# Patient Record
Sex: Female | Born: 1940
Health system: Southern US, Community
[De-identification: ages and names within clinical notes are randomized; demographics above are authoritative.]

---

## 1997-11-23 ENCOUNTER — Other Ambulatory Visit: Admission: RE | Admit: 1997-11-23 | Discharge: 1997-11-23 | Payer: Self-pay | Admitting: *Deleted

## 1998-12-21 ENCOUNTER — Other Ambulatory Visit: Admission: RE | Admit: 1998-12-21 | Discharge: 1998-12-21 | Payer: Self-pay | Admitting: *Deleted

## 1999-07-01 ENCOUNTER — Emergency Department (HOSPITAL_COMMUNITY): Admission: EM | Admit: 1999-07-01 | Discharge: 1999-07-01 | Payer: Self-pay

## 2000-01-21 ENCOUNTER — Other Ambulatory Visit: Admission: RE | Admit: 2000-01-21 | Discharge: 2000-01-21 | Payer: Self-pay | Admitting: *Deleted

## 2001-01-26 ENCOUNTER — Other Ambulatory Visit: Admission: RE | Admit: 2001-01-26 | Discharge: 2001-01-26 | Payer: Self-pay | Admitting: *Deleted

## 2001-01-28 ENCOUNTER — Encounter: Payer: Self-pay | Admitting: *Deleted

## 2001-01-28 ENCOUNTER — Encounter: Admission: RE | Admit: 2001-01-28 | Discharge: 2001-01-28 | Payer: Self-pay | Admitting: *Deleted

## 2001-11-25 ENCOUNTER — Ambulatory Visit (HOSPITAL_COMMUNITY): Admission: RE | Admit: 2001-11-25 | Discharge: 2001-11-25 | Payer: Self-pay | Admitting: Gastroenterology

## 2002-02-16 ENCOUNTER — Encounter: Admission: RE | Admit: 2002-02-16 | Discharge: 2002-02-16 | Payer: Self-pay | Admitting: Family Medicine

## 2002-02-16 ENCOUNTER — Encounter: Payer: Self-pay | Admitting: Family Medicine

## 2002-03-16 ENCOUNTER — Other Ambulatory Visit: Admission: RE | Admit: 2002-03-16 | Discharge: 2002-03-16 | Payer: Self-pay | Admitting: Obstetrics and Gynecology

## 2003-03-16 ENCOUNTER — Encounter: Admission: RE | Admit: 2003-03-16 | Discharge: 2003-03-16 | Payer: Self-pay | Admitting: Obstetrics and Gynecology

## 2003-04-28 ENCOUNTER — Other Ambulatory Visit: Admission: RE | Admit: 2003-04-28 | Discharge: 2003-04-28 | Payer: Self-pay | Admitting: Obstetrics and Gynecology

## 2004-03-22 ENCOUNTER — Ambulatory Visit (HOSPITAL_COMMUNITY): Admission: RE | Admit: 2004-03-22 | Discharge: 2004-03-22 | Payer: Self-pay | Admitting: Obstetrics and Gynecology

## 2004-05-17 ENCOUNTER — Other Ambulatory Visit: Admission: RE | Admit: 2004-05-17 | Discharge: 2004-05-17 | Payer: Self-pay | Admitting: *Deleted

## 2005-04-15 ENCOUNTER — Ambulatory Visit (HOSPITAL_COMMUNITY): Admission: RE | Admit: 2005-04-15 | Discharge: 2005-04-15 | Payer: Self-pay | Admitting: Obstetrics & Gynecology

## 2005-05-29 ENCOUNTER — Other Ambulatory Visit: Admission: RE | Admit: 2005-05-29 | Discharge: 2005-05-29 | Payer: Self-pay | Admitting: Obstetrics & Gynecology

## 2006-05-01 ENCOUNTER — Ambulatory Visit (HOSPITAL_COMMUNITY): Admission: RE | Admit: 2006-05-01 | Discharge: 2006-05-01 | Payer: Self-pay | Admitting: Obstetrics & Gynecology

## 2007-05-27 ENCOUNTER — Ambulatory Visit (HOSPITAL_COMMUNITY): Admission: RE | Admit: 2007-05-27 | Discharge: 2007-05-27 | Payer: Self-pay | Admitting: Obstetrics & Gynecology

## 2008-03-23 ENCOUNTER — Other Ambulatory Visit: Admission: RE | Admit: 2008-03-23 | Discharge: 2008-03-23 | Payer: Self-pay | Admitting: Obstetrics & Gynecology

## 2008-06-10 ENCOUNTER — Ambulatory Visit (HOSPITAL_COMMUNITY): Admission: RE | Admit: 2008-06-10 | Discharge: 2008-06-10 | Payer: Self-pay | Admitting: Family Medicine

## 2009-07-20 ENCOUNTER — Ambulatory Visit (HOSPITAL_COMMUNITY): Admission: RE | Admit: 2009-07-20 | Discharge: 2009-07-20 | Payer: Self-pay | Admitting: Family Medicine

## 2010-01-28 ENCOUNTER — Encounter: Payer: Self-pay | Admitting: Obstetrics & Gynecology

## 2010-05-25 NOTE — Op Note (Signed)
   NAMEGIULIANNA, ROCHA ANN                       ACCOUNT NO.:  [id number]   MEDICAL RECORD NO.:  [id number]                   PATIENT TYPE:  AMB   LOCATION:  ENDO                                 FACILITY:  [facility]   PHYSICIAN:  Danise Edge, M.D.                DATE OF BIRTH:  May 10, 1940   DATE OF PROCEDURE:  11/25/2001  DATE OF DISCHARGE:                                 OPERATIVE REPORT   PROCEDURE:  Proctocolonoscopy to the splenic flexure.   INDICATIONS FOR PROCEDURE:  Ms. Lailah Marcelli is a 70 year old female born  29-Dec-1940. Approximately twice a year, Ms. Orton will experience  predominantly nocturnal anal discomfort which resolves over a period of a  few minutes to hours after taking Tylenol. On occasion, she will spot fresh  blood on the toilet tissue following normal bowel movements. Her December 25, 1997 health maintenance flexible proctosigmoidoscopy was normal.   ALLERGIES:  PENICILLIN.   PAST MEDICAL HISTORY:  Eczema.   FAMILY HISTORY:  Negative for colon cancer.   ENDOSCOPIST:  Charolett Bumpers, M.D.   PREMEDICATION:  Demerol 50 mg subcutaneously and 20 mg intravenously, Versed  5 mg subcutaneously and 2.5 mg intravenously. Subcutaneous injections of  medication was due to infiltration of the IV.   DESCRIPTION OF PROCEDURE:  After obtaining informed consent, Ms. Cropley was  placed in the left lateral decubitus position. She received intravenous  Demerol and intravenous Versed for conscious sedation for the procedure. The  patient's blood pressure, oxygen saturation and cardiac rhythm were  monitored throughout the procedure and documented in the medical record.   Anal inspection was normal. Digital rectal exam was normal. The Olympus  pediatric video colonoscope was introduced into the rectum and advanced to  the splenic flexure. Due to colonic loop formation which cannot be overcome  despite repositioning the patient from the left lateral decubitus  position  to the supine position and right lateral decubitus position with the  application of external abdominal pressure, a complete colonoscopy was not  performed.   Endoscopic appearance of the rectum, sigmoid colon, descending colon, and  splenic flexure was normal.  There was no endoscopic evidence for the  presence of colorectal neoplasia or inflammatory bowel disease.    ASSESSMENT:  Normal flexible proctocolonoscopy to the splenic flexure. Due  to colonic loop formation, I was unable to complete a full colonoscopy.                                               Danise Edge, M.D.    MJ/MEDQ  D:  11/25/2001  T:  11/25/2001  Job:  [id number]   cc:   Donia Guiles, M.D.  [address] [place]  [place] [address]  Fax: [phone]

## 2010-07-25 ENCOUNTER — Other Ambulatory Visit: Payer: Self-pay | Admitting: Obstetrics & Gynecology

## 2010-07-25 DIAGNOSIS — Z1231 Encounter for screening mammogram for malignant neoplasm of breast: Secondary | ICD-10-CM

## 2010-08-03 ENCOUNTER — Ambulatory Visit (HOSPITAL_COMMUNITY)
Admission: RE | Admit: 2010-08-03 | Discharge: 2010-08-03 | Disposition: A | Discharge status: Home / Self Care | Payer: Medicare Other | Source: Ambulatory Visit | Attending: Obstetrics & Gynecology | Admitting: Obstetrics & Gynecology

## 2010-08-03 DIAGNOSIS — Z1231 Encounter for screening mammogram for malignant neoplasm of breast: Secondary | ICD-10-CM

## 2011-01-23 DIAGNOSIS — M069 Rheumatoid arthritis, unspecified: Secondary | ICD-10-CM | POA: Diagnosis not present

## 2011-01-23 DIAGNOSIS — L02619 Cutaneous abscess of unspecified foot: Secondary | ICD-10-CM | POA: Diagnosis not present

## 2011-01-23 DIAGNOSIS — B372 Candidiasis of skin and nail: Secondary | ICD-10-CM | POA: Diagnosis not present

## 2011-01-23 DIAGNOSIS — L03039 Cellulitis of unspecified toe: Secondary | ICD-10-CM | POA: Diagnosis not present

## 2011-02-06 DIAGNOSIS — L02619 Cutaneous abscess of unspecified foot: Secondary | ICD-10-CM | POA: Diagnosis not present

## 2011-02-06 DIAGNOSIS — L03039 Cellulitis of unspecified toe: Secondary | ICD-10-CM | POA: Diagnosis not present

## 2011-02-06 DIAGNOSIS — B372 Candidiasis of skin and nail: Secondary | ICD-10-CM | POA: Diagnosis not present

## 2011-02-11 DIAGNOSIS — H04129 Dry eye syndrome of unspecified lacrimal gland: Secondary | ICD-10-CM | POA: Diagnosis not present

## 2011-04-18 DIAGNOSIS — L259 Unspecified contact dermatitis, unspecified cause: Secondary | ICD-10-CM | POA: Diagnosis not present

## 2011-04-18 DIAGNOSIS — D485 Neoplasm of uncertain behavior of skin: Secondary | ICD-10-CM | POA: Diagnosis not present

## 2011-04-24 DIAGNOSIS — Z1211 Encounter for screening for malignant neoplasm of colon: Secondary | ICD-10-CM | POA: Diagnosis not present

## 2011-04-26 DIAGNOSIS — L259 Unspecified contact dermatitis, unspecified cause: Secondary | ICD-10-CM | POA: Diagnosis not present

## 2011-05-02 DIAGNOSIS — L930 Discoid lupus erythematosus: Secondary | ICD-10-CM | POA: Diagnosis not present

## 2011-05-13 DIAGNOSIS — L930 Discoid lupus erythematosus: Secondary | ICD-10-CM | POA: Diagnosis not present

## 2011-05-20 DIAGNOSIS — Z79899 Other long term (current) drug therapy: Secondary | ICD-10-CM | POA: Diagnosis not present

## 2011-05-29 DIAGNOSIS — IMO0002 Reserved for concepts with insufficient information to code with codable children: Secondary | ICD-10-CM | POA: Diagnosis not present

## 2011-05-29 DIAGNOSIS — L02419 Cutaneous abscess of limb, unspecified: Secondary | ICD-10-CM | POA: Diagnosis not present

## 2011-05-30 DIAGNOSIS — L930 Discoid lupus erythematosus: Secondary | ICD-10-CM | POA: Diagnosis not present

## 2011-08-15 ENCOUNTER — Other Ambulatory Visit (HOSPITAL_COMMUNITY): Payer: Self-pay | Admitting: Advanced Practice Midwife

## 2011-08-15 DIAGNOSIS — Z1231 Encounter for screening mammogram for malignant neoplasm of breast: Secondary | ICD-10-CM

## 2011-09-10 ENCOUNTER — Ambulatory Visit (HOSPITAL_COMMUNITY)
Admission: RE | Admit: 2011-09-10 | Discharge: 2011-09-10 | Disposition: A | Discharge status: Home / Self Care | Payer: Medicare Other | Source: Ambulatory Visit | Attending: Advanced Practice Midwife | Admitting: Advanced Practice Midwife

## 2011-09-10 DIAGNOSIS — Z1231 Encounter for screening mammogram for malignant neoplasm of breast: Secondary | ICD-10-CM | POA: Insufficient documentation

## 2011-09-17 DIAGNOSIS — M329 Systemic lupus erythematosus, unspecified: Secondary | ICD-10-CM | POA: Diagnosis not present

## 2011-10-17 DIAGNOSIS — Z23 Encounter for immunization: Secondary | ICD-10-CM | POA: Diagnosis not present

## 2011-11-12 DIAGNOSIS — Z79899 Other long term (current) drug therapy: Secondary | ICD-10-CM | POA: Diagnosis not present

## 2011-11-15 DIAGNOSIS — E039 Hypothyroidism, unspecified: Secondary | ICD-10-CM | POA: Diagnosis not present

## 2011-11-15 DIAGNOSIS — E782 Mixed hyperlipidemia: Secondary | ICD-10-CM | POA: Diagnosis not present

## 2011-11-15 DIAGNOSIS — M949 Disorder of cartilage, unspecified: Secondary | ICD-10-CM | POA: Diagnosis not present

## 2011-11-15 DIAGNOSIS — Z1211 Encounter for screening for malignant neoplasm of colon: Secondary | ICD-10-CM | POA: Diagnosis not present

## 2011-11-15 DIAGNOSIS — Z01419 Encounter for gynecological examination (general) (routine) without abnormal findings: Secondary | ICD-10-CM | POA: Diagnosis not present

## 2011-11-15 DIAGNOSIS — Z0000 Encounter for general adult medical examination without abnormal findings: Secondary | ICD-10-CM | POA: Diagnosis not present

## 2011-11-15 DIAGNOSIS — M899 Disorder of bone, unspecified: Secondary | ICD-10-CM | POA: Diagnosis not present

## 2011-11-26 DIAGNOSIS — L259 Unspecified contact dermatitis, unspecified cause: Secondary | ICD-10-CM | POA: Diagnosis not present

## 2012-05-14 DIAGNOSIS — E782 Mixed hyperlipidemia: Secondary | ICD-10-CM | POA: Diagnosis not present

## 2012-07-06 DIAGNOSIS — L930 Discoid lupus erythematosus: Secondary | ICD-10-CM | POA: Diagnosis not present

## 2012-07-14 DIAGNOSIS — L930 Discoid lupus erythematosus: Secondary | ICD-10-CM | POA: Diagnosis not present

## 2012-10-22 DIAGNOSIS — Z23 Encounter for immunization: Secondary | ICD-10-CM | POA: Diagnosis not present

## 2012-11-19 ENCOUNTER — Other Ambulatory Visit (HOSPITAL_COMMUNITY): Payer: Self-pay | Admitting: Family Medicine

## 2012-11-19 DIAGNOSIS — E039 Hypothyroidism, unspecified: Secondary | ICD-10-CM | POA: Diagnosis not present

## 2012-11-19 DIAGNOSIS — Z1211 Encounter for screening for malignant neoplasm of colon: Secondary | ICD-10-CM | POA: Diagnosis not present

## 2012-11-19 DIAGNOSIS — Z1231 Encounter for screening mammogram for malignant neoplasm of breast: Secondary | ICD-10-CM

## 2012-11-19 DIAGNOSIS — M899 Disorder of bone, unspecified: Secondary | ICD-10-CM | POA: Diagnosis not present

## 2012-11-19 DIAGNOSIS — Z23 Encounter for immunization: Secondary | ICD-10-CM | POA: Diagnosis not present

## 2012-11-19 DIAGNOSIS — E782 Mixed hyperlipidemia: Secondary | ICD-10-CM | POA: Diagnosis not present

## 2012-11-19 DIAGNOSIS — M6281 Muscle weakness (generalized): Secondary | ICD-10-CM | POA: Diagnosis not present

## 2012-11-19 DIAGNOSIS — R1011 Right upper quadrant pain: Secondary | ICD-10-CM | POA: Diagnosis not present

## 2012-11-19 DIAGNOSIS — Z01419 Encounter for gynecological examination (general) (routine) without abnormal findings: Secondary | ICD-10-CM | POA: Diagnosis not present

## 2012-11-19 DIAGNOSIS — Z0000 Encounter for general adult medical examination without abnormal findings: Secondary | ICD-10-CM | POA: Diagnosis not present

## 2012-11-24 DIAGNOSIS — R29898 Other symptoms and signs involving the musculoskeletal system: Secondary | ICD-10-CM | POA: Diagnosis not present

## 2012-12-01 DIAGNOSIS — R29898 Other symptoms and signs involving the musculoskeletal system: Secondary | ICD-10-CM | POA: Diagnosis not present

## 2012-12-07 ENCOUNTER — Ambulatory Visit (HOSPITAL_COMMUNITY)
Admission: RE | Admit: 2012-12-07 | Discharge: 2012-12-07 | Disposition: A | Discharge status: Home / Self Care | Payer: Medicare Other | Source: Ambulatory Visit | Attending: Family Medicine | Admitting: Family Medicine

## 2012-12-07 DIAGNOSIS — Z1231 Encounter for screening mammogram for malignant neoplasm of breast: Secondary | ICD-10-CM | POA: Insufficient documentation

## 2012-12-22 DIAGNOSIS — M899 Disorder of bone, unspecified: Secondary | ICD-10-CM | POA: Diagnosis not present

## 2012-12-28 DIAGNOSIS — L930 Discoid lupus erythematosus: Secondary | ICD-10-CM | POA: Diagnosis not present

## 2013-01-06 DIAGNOSIS — L930 Discoid lupus erythematosus: Secondary | ICD-10-CM | POA: Diagnosis not present

## 2013-01-12 DIAGNOSIS — L02419 Cutaneous abscess of limb, unspecified: Secondary | ICD-10-CM | POA: Diagnosis not present

## 2013-01-12 DIAGNOSIS — L03119 Cellulitis of unspecified part of limb: Secondary | ICD-10-CM | POA: Diagnosis not present

## 2013-01-15 DIAGNOSIS — L02419 Cutaneous abscess of limb, unspecified: Secondary | ICD-10-CM | POA: Diagnosis not present

## 2013-01-15 DIAGNOSIS — L03119 Cellulitis of unspecified part of limb: Secondary | ICD-10-CM | POA: Diagnosis not present

## 2013-03-09 ENCOUNTER — Ambulatory Visit
Admission: RE | Admit: 2013-03-09 | Discharge: 2013-03-09 | Disposition: A | Discharge status: Home / Self Care | Payer: Medicare Other | Source: Ambulatory Visit | Attending: Family Medicine | Admitting: Family Medicine

## 2013-03-09 ENCOUNTER — Other Ambulatory Visit: Payer: Self-pay | Admitting: Family Medicine

## 2013-03-09 DIAGNOSIS — R079 Chest pain, unspecified: Secondary | ICD-10-CM | POA: Diagnosis not present

## 2013-03-09 DIAGNOSIS — J438 Other emphysema: Secondary | ICD-10-CM | POA: Diagnosis not present

## 2013-06-07 DIAGNOSIS — H2510 Age-related nuclear cataract, unspecified eye: Secondary | ICD-10-CM | POA: Diagnosis not present

## 2013-06-07 DIAGNOSIS — H43819 Vitreous degeneration, unspecified eye: Secondary | ICD-10-CM | POA: Diagnosis not present

## 2013-07-02 DIAGNOSIS — L930 Discoid lupus erythematosus: Secondary | ICD-10-CM | POA: Diagnosis not present

## 2013-07-02 DIAGNOSIS — Z79899 Other long term (current) drug therapy: Secondary | ICD-10-CM | POA: Diagnosis not present

## 2013-07-02 DIAGNOSIS — L408 Other psoriasis: Secondary | ICD-10-CM | POA: Diagnosis not present

## 2013-11-02 DIAGNOSIS — Z23 Encounter for immunization: Secondary | ICD-10-CM | POA: Diagnosis not present

## 2013-12-07 DIAGNOSIS — E039 Hypothyroidism, unspecified: Secondary | ICD-10-CM | POA: Diagnosis not present

## 2013-12-07 DIAGNOSIS — M8580 Other specified disorders of bone density and structure, unspecified site: Secondary | ICD-10-CM | POA: Diagnosis not present

## 2013-12-07 DIAGNOSIS — H04129 Dry eye syndrome of unspecified lacrimal gland: Secondary | ICD-10-CM | POA: Diagnosis not present

## 2013-12-07 DIAGNOSIS — L931 Subacute cutaneous lupus erythematosus: Secondary | ICD-10-CM | POA: Diagnosis not present

## 2013-12-07 DIAGNOSIS — E782 Mixed hyperlipidemia: Secondary | ICD-10-CM | POA: Diagnosis not present

## 2013-12-07 DIAGNOSIS — E46 Unspecified protein-calorie malnutrition: Secondary | ICD-10-CM | POA: Diagnosis not present

## 2013-12-07 DIAGNOSIS — Z0001 Encounter for general adult medical examination with abnormal findings: Secondary | ICD-10-CM | POA: Diagnosis not present

## 2013-12-28 DIAGNOSIS — L821 Other seborrheic keratosis: Secondary | ICD-10-CM | POA: Diagnosis not present

## 2013-12-28 DIAGNOSIS — L570 Actinic keratosis: Secondary | ICD-10-CM | POA: Diagnosis not present

## 2013-12-28 DIAGNOSIS — L931 Subacute cutaneous lupus erythematosus: Secondary | ICD-10-CM | POA: Diagnosis not present

## 2014-02-15 DIAGNOSIS — L931 Subacute cutaneous lupus erythematosus: Secondary | ICD-10-CM | POA: Diagnosis not present

## 2014-03-16 DIAGNOSIS — L931 Subacute cutaneous lupus erythematosus: Secondary | ICD-10-CM | POA: Diagnosis not present

## 2014-03-16 DIAGNOSIS — Z79899 Other long term (current) drug therapy: Secondary | ICD-10-CM | POA: Diagnosis not present

## 2014-04-13 DIAGNOSIS — L300 Nummular dermatitis: Secondary | ICD-10-CM | POA: Diagnosis not present

## 2014-04-13 DIAGNOSIS — L931 Subacute cutaneous lupus erythematosus: Secondary | ICD-10-CM | POA: Diagnosis not present

## 2014-06-21 DIAGNOSIS — H35383 Toxic maculopathy, bilateral: Secondary | ICD-10-CM | POA: Diagnosis not present

## 2014-06-21 DIAGNOSIS — H04123 Dry eye syndrome of bilateral lacrimal glands: Secondary | ICD-10-CM | POA: Diagnosis not present

## 2014-06-21 DIAGNOSIS — H2513 Age-related nuclear cataract, bilateral: Secondary | ICD-10-CM | POA: Diagnosis not present

## 2014-06-22 DIAGNOSIS — L931 Subacute cutaneous lupus erythematosus: Secondary | ICD-10-CM | POA: Diagnosis not present

## 2014-08-17 DIAGNOSIS — L930 Discoid lupus erythematosus: Secondary | ICD-10-CM | POA: Diagnosis not present

## 2014-08-17 DIAGNOSIS — L900 Lichen sclerosus et atrophicus: Secondary | ICD-10-CM | POA: Diagnosis not present

## 2014-08-17 DIAGNOSIS — L400 Psoriasis vulgaris: Secondary | ICD-10-CM | POA: Diagnosis not present

## 2014-09-28 DIAGNOSIS — L900 Lichen sclerosus et atrophicus: Secondary | ICD-10-CM | POA: Diagnosis not present

## 2014-09-28 DIAGNOSIS — L400 Psoriasis vulgaris: Secondary | ICD-10-CM | POA: Diagnosis not present

## 2014-09-28 DIAGNOSIS — L931 Subacute cutaneous lupus erythematosus: Secondary | ICD-10-CM | POA: Diagnosis not present

## 2014-10-27 DIAGNOSIS — Z23 Encounter for immunization: Secondary | ICD-10-CM | POA: Diagnosis not present

## 2014-12-23 ENCOUNTER — Other Ambulatory Visit: Payer: Self-pay | Admitting: Family Medicine

## 2014-12-23 DIAGNOSIS — L931 Subacute cutaneous lupus erythematosus: Secondary | ICD-10-CM | POA: Diagnosis not present

## 2014-12-23 DIAGNOSIS — Z1211 Encounter for screening for malignant neoplasm of colon: Secondary | ICD-10-CM | POA: Diagnosis not present

## 2014-12-23 DIAGNOSIS — Z1231 Encounter for screening mammogram for malignant neoplasm of breast: Secondary | ICD-10-CM

## 2014-12-23 DIAGNOSIS — S61219A Laceration without foreign body of unspecified finger without damage to nail, initial encounter: Secondary | ICD-10-CM | POA: Diagnosis not present

## 2014-12-23 DIAGNOSIS — H04129 Dry eye syndrome of unspecified lacrimal gland: Secondary | ICD-10-CM | POA: Diagnosis not present

## 2014-12-23 DIAGNOSIS — Z23 Encounter for immunization: Secondary | ICD-10-CM | POA: Diagnosis not present

## 2014-12-23 DIAGNOSIS — E039 Hypothyroidism, unspecified: Secondary | ICD-10-CM | POA: Diagnosis not present

## 2014-12-23 DIAGNOSIS — E46 Unspecified protein-calorie malnutrition: Secondary | ICD-10-CM | POA: Diagnosis not present

## 2014-12-23 DIAGNOSIS — E782 Mixed hyperlipidemia: Secondary | ICD-10-CM | POA: Diagnosis not present

## 2014-12-23 DIAGNOSIS — M8580 Other specified disorders of bone density and structure, unspecified site: Secondary | ICD-10-CM | POA: Diagnosis not present

## 2014-12-23 DIAGNOSIS — Z0001 Encounter for general adult medical examination with abnormal findings: Secondary | ICD-10-CM | POA: Diagnosis not present

## 2014-12-23 DIAGNOSIS — T148 Other injury of unspecified body region: Secondary | ICD-10-CM | POA: Diagnosis not present

## 2015-01-06 DIAGNOSIS — L309 Dermatitis, unspecified: Secondary | ICD-10-CM | POA: Diagnosis not present

## 2015-01-06 DIAGNOSIS — L298 Other pruritus: Secondary | ICD-10-CM | POA: Diagnosis not present

## 2015-01-06 DIAGNOSIS — L931 Subacute cutaneous lupus erythematosus: Secondary | ICD-10-CM | POA: Diagnosis not present

## 2015-01-24 ENCOUNTER — Ambulatory Visit: Payer: Medicare Other

## 2015-02-01 DIAGNOSIS — M859 Disorder of bone density and structure, unspecified: Secondary | ICD-10-CM | POA: Diagnosis not present

## 2015-02-01 DIAGNOSIS — M8589 Other specified disorders of bone density and structure, multiple sites: Secondary | ICD-10-CM | POA: Diagnosis not present

## 2015-02-06 DIAGNOSIS — Z1211 Encounter for screening for malignant neoplasm of colon: Secondary | ICD-10-CM | POA: Diagnosis not present

## 2015-02-09 ENCOUNTER — Ambulatory Visit
Admission: RE | Admit: 2015-02-09 | Discharge: 2015-02-09 | Disposition: A | Discharge status: Home / Self Care | Payer: Medicare Other | Source: Ambulatory Visit | Attending: Family Medicine | Admitting: Family Medicine

## 2015-02-09 DIAGNOSIS — Z1231 Encounter for screening mammogram for malignant neoplasm of breast: Secondary | ICD-10-CM

## 2015-02-19 DIAGNOSIS — R0789 Other chest pain: Secondary | ICD-10-CM | POA: Diagnosis not present

## 2015-03-29 DIAGNOSIS — D692 Other nonthrombocytopenic purpura: Secondary | ICD-10-CM | POA: Diagnosis not present

## 2015-03-29 DIAGNOSIS — L931 Subacute cutaneous lupus erythematosus: Secondary | ICD-10-CM | POA: Diagnosis not present

## 2015-06-26 DIAGNOSIS — H2513 Age-related nuclear cataract, bilateral: Secondary | ICD-10-CM | POA: Diagnosis not present

## 2015-06-26 DIAGNOSIS — H16223 Keratoconjunctivitis sicca, not specified as Sjogren's, bilateral: Secondary | ICD-10-CM | POA: Diagnosis not present

## 2015-08-21 DIAGNOSIS — H04123 Dry eye syndrome of bilateral lacrimal glands: Secondary | ICD-10-CM | POA: Diagnosis not present

## 2015-10-14 DIAGNOSIS — S0003XA Contusion of scalp, initial encounter: Secondary | ICD-10-CM | POA: Diagnosis not present

## 2015-10-14 DIAGNOSIS — W108XXA Fall (on) (from) other stairs and steps, initial encounter: Secondary | ICD-10-CM | POA: Diagnosis not present

## 2015-10-14 DIAGNOSIS — S9032XA Contusion of left foot, initial encounter: Secondary | ICD-10-CM | POA: Diagnosis not present

## 2015-11-09 DIAGNOSIS — Z23 Encounter for immunization: Secondary | ICD-10-CM | POA: Diagnosis not present

## 2016-01-04 DIAGNOSIS — N7689 Other specified inflammation of vagina and vulva: Secondary | ICD-10-CM | POA: Diagnosis not present

## 2016-01-04 DIAGNOSIS — N952 Postmenopausal atrophic vaginitis: Secondary | ICD-10-CM | POA: Diagnosis not present

## 2016-01-16 ENCOUNTER — Other Ambulatory Visit: Payer: Self-pay | Admitting: Obstetrics & Gynecology

## 2016-01-16 DIAGNOSIS — Z1231 Encounter for screening mammogram for malignant neoplasm of breast: Secondary | ICD-10-CM

## 2016-02-12 ENCOUNTER — Ambulatory Visit
Admission: RE | Admit: 2016-02-12 | Discharge: 2016-02-12 | Disposition: A | Discharge status: Home / Self Care | Payer: Medicare Other | Source: Ambulatory Visit | Attending: Obstetrics & Gynecology | Admitting: Obstetrics & Gynecology

## 2016-02-12 DIAGNOSIS — Z1231 Encounter for screening mammogram for malignant neoplasm of breast: Secondary | ICD-10-CM | POA: Diagnosis not present

## 2016-02-15 DIAGNOSIS — E782 Mixed hyperlipidemia: Secondary | ICD-10-CM | POA: Diagnosis not present

## 2016-02-15 DIAGNOSIS — M8580 Other specified disorders of bone density and structure, unspecified site: Secondary | ICD-10-CM | POA: Diagnosis not present

## 2016-02-15 DIAGNOSIS — N952 Postmenopausal atrophic vaginitis: Secondary | ICD-10-CM | POA: Diagnosis not present

## 2016-02-15 DIAGNOSIS — L931 Subacute cutaneous lupus erythematosus: Secondary | ICD-10-CM | POA: Diagnosis not present

## 2016-02-15 DIAGNOSIS — Z0000 Encounter for general adult medical examination without abnormal findings: Secondary | ICD-10-CM | POA: Diagnosis not present

## 2016-02-15 DIAGNOSIS — H04129 Dry eye syndrome of unspecified lacrimal gland: Secondary | ICD-10-CM | POA: Diagnosis not present

## 2016-02-15 DIAGNOSIS — E039 Hypothyroidism, unspecified: Secondary | ICD-10-CM | POA: Diagnosis not present

## 2016-03-14 DIAGNOSIS — Z6821 Body mass index (BMI) 21.0-21.9, adult: Secondary | ICD-10-CM | POA: Diagnosis not present

## 2016-03-14 DIAGNOSIS — M3500 Sicca syndrome, unspecified: Secondary | ICD-10-CM | POA: Diagnosis not present

## 2016-03-14 DIAGNOSIS — L931 Subacute cutaneous lupus erythematosus: Secondary | ICD-10-CM | POA: Diagnosis not present

## 2016-03-14 DIAGNOSIS — R768 Other specified abnormal immunological findings in serum: Secondary | ICD-10-CM | POA: Diagnosis not present

## 2016-05-15 DIAGNOSIS — Z6821 Body mass index (BMI) 21.0-21.9, adult: Secondary | ICD-10-CM | POA: Diagnosis not present

## 2016-05-15 DIAGNOSIS — M3500 Sicca syndrome, unspecified: Secondary | ICD-10-CM | POA: Diagnosis not present

## 2016-05-15 DIAGNOSIS — L931 Subacute cutaneous lupus erythematosus: Secondary | ICD-10-CM | POA: Diagnosis not present

## 2016-05-15 DIAGNOSIS — R768 Other specified abnormal immunological findings in serum: Secondary | ICD-10-CM | POA: Diagnosis not present

## 2016-06-26 DIAGNOSIS — H2513 Age-related nuclear cataract, bilateral: Secondary | ICD-10-CM | POA: Diagnosis not present

## 2016-09-16 DIAGNOSIS — L931 Subacute cutaneous lupus erythematosus: Secondary | ICD-10-CM | POA: Diagnosis not present

## 2016-09-16 DIAGNOSIS — Z6821 Body mass index (BMI) 21.0-21.9, adult: Secondary | ICD-10-CM | POA: Diagnosis not present

## 2016-09-16 DIAGNOSIS — M3500 Sicca syndrome, unspecified: Secondary | ICD-10-CM | POA: Diagnosis not present

## 2016-09-16 DIAGNOSIS — R768 Other specified abnormal immunological findings in serum: Secondary | ICD-10-CM | POA: Diagnosis not present

## 2016-10-03 DIAGNOSIS — L821 Other seborrheic keratosis: Secondary | ICD-10-CM | POA: Diagnosis not present

## 2016-10-03 DIAGNOSIS — L218 Other seborrheic dermatitis: Secondary | ICD-10-CM | POA: Diagnosis not present

## 2016-10-03 DIAGNOSIS — D2262 Melanocytic nevi of left upper limb, including shoulder: Secondary | ICD-10-CM | POA: Diagnosis not present

## 2016-10-03 DIAGNOSIS — L570 Actinic keratosis: Secondary | ICD-10-CM | POA: Diagnosis not present

## 2016-10-03 DIAGNOSIS — D692 Other nonthrombocytopenic purpura: Secondary | ICD-10-CM | POA: Diagnosis not present

## 2016-10-03 DIAGNOSIS — D1801 Hemangioma of skin and subcutaneous tissue: Secondary | ICD-10-CM | POA: Diagnosis not present

## 2016-10-03 DIAGNOSIS — D2271 Melanocytic nevi of right lower limb, including hip: Secondary | ICD-10-CM | POA: Diagnosis not present

## 2016-12-03 DIAGNOSIS — Z23 Encounter for immunization: Secondary | ICD-10-CM | POA: Diagnosis not present

## 2017-01-09 DIAGNOSIS — L309 Dermatitis, unspecified: Secondary | ICD-10-CM | POA: Diagnosis not present

## 2017-01-09 DIAGNOSIS — Z01411 Encounter for gynecological examination (general) (routine) with abnormal findings: Secondary | ICD-10-CM | POA: Diagnosis not present

## 2017-02-24 ENCOUNTER — Other Ambulatory Visit: Payer: Self-pay | Admitting: Family Medicine

## 2017-02-24 DIAGNOSIS — Z0000 Encounter for general adult medical examination without abnormal findings: Secondary | ICD-10-CM | POA: Diagnosis not present

## 2017-02-24 DIAGNOSIS — E782 Mixed hyperlipidemia: Secondary | ICD-10-CM | POA: Diagnosis not present

## 2017-02-24 DIAGNOSIS — Z1231 Encounter for screening mammogram for malignant neoplasm of breast: Secondary | ICD-10-CM

## 2017-02-24 DIAGNOSIS — L931 Subacute cutaneous lupus erythematosus: Secondary | ICD-10-CM | POA: Diagnosis not present

## 2017-02-24 DIAGNOSIS — E46 Unspecified protein-calorie malnutrition: Secondary | ICD-10-CM | POA: Diagnosis not present

## 2017-02-24 DIAGNOSIS — E039 Hypothyroidism, unspecified: Secondary | ICD-10-CM | POA: Diagnosis not present

## 2017-02-24 DIAGNOSIS — H04129 Dry eye syndrome of unspecified lacrimal gland: Secondary | ICD-10-CM | POA: Diagnosis not present

## 2017-02-24 DIAGNOSIS — M8580 Other specified disorders of bone density and structure, unspecified site: Secondary | ICD-10-CM | POA: Diagnosis not present

## 2017-03-13 ENCOUNTER — Ambulatory Visit
Admission: RE | Admit: 2017-03-13 | Discharge: 2017-03-13 | Disposition: A | Discharge status: Home / Self Care | Payer: Medicare Other | Source: Ambulatory Visit | Attending: Family Medicine | Admitting: Family Medicine

## 2017-03-13 DIAGNOSIS — Z1231 Encounter for screening mammogram for malignant neoplasm of breast: Secondary | ICD-10-CM | POA: Diagnosis not present

## 2017-03-17 DIAGNOSIS — M3500 Sicca syndrome, unspecified: Secondary | ICD-10-CM | POA: Diagnosis not present

## 2017-03-17 DIAGNOSIS — R768 Other specified abnormal immunological findings in serum: Secondary | ICD-10-CM | POA: Diagnosis not present

## 2017-03-17 DIAGNOSIS — Z6820 Body mass index (BMI) 20.0-20.9, adult: Secondary | ICD-10-CM | POA: Diagnosis not present

## 2017-03-17 DIAGNOSIS — L931 Subacute cutaneous lupus erythematosus: Secondary | ICD-10-CM | POA: Diagnosis not present

## 2017-03-27 DIAGNOSIS — K5730 Diverticulosis of large intestine without perforation or abscess without bleeding: Secondary | ICD-10-CM | POA: Diagnosis not present

## 2017-03-27 DIAGNOSIS — Z1211 Encounter for screening for malignant neoplasm of colon: Secondary | ICD-10-CM | POA: Diagnosis not present

## 2017-03-27 DIAGNOSIS — K52831 Collagenous colitis: Secondary | ICD-10-CM | POA: Diagnosis not present

## 2017-04-01 DIAGNOSIS — K52831 Collagenous colitis: Secondary | ICD-10-CM | POA: Diagnosis not present

## 2017-04-17 DIAGNOSIS — M8588 Other specified disorders of bone density and structure, other site: Secondary | ICD-10-CM | POA: Diagnosis not present

## 2017-04-24 DIAGNOSIS — K52832 Lymphocytic colitis: Secondary | ICD-10-CM | POA: Diagnosis not present

## 2017-06-25 DIAGNOSIS — K52832 Lymphocytic colitis: Secondary | ICD-10-CM | POA: Diagnosis not present

## 2017-09-09 DIAGNOSIS — H2513 Age-related nuclear cataract, bilateral: Secondary | ICD-10-CM | POA: Diagnosis not present

## 2017-09-09 DIAGNOSIS — H25043 Posterior subcapsular polar age-related cataract, bilateral: Secondary | ICD-10-CM | POA: Diagnosis not present

## 2017-09-09 DIAGNOSIS — H524 Presbyopia: Secondary | ICD-10-CM | POA: Diagnosis not present

## 2017-09-09 DIAGNOSIS — H5213 Myopia, bilateral: Secondary | ICD-10-CM | POA: Diagnosis not present

## 2017-09-09 DIAGNOSIS — H25013 Cortical age-related cataract, bilateral: Secondary | ICD-10-CM | POA: Diagnosis not present

## 2017-09-09 DIAGNOSIS — H52223 Regular astigmatism, bilateral: Secondary | ICD-10-CM | POA: Diagnosis not present

## 2017-09-09 DIAGNOSIS — H43813 Vitreous degeneration, bilateral: Secondary | ICD-10-CM | POA: Diagnosis not present

## 2017-10-08 DIAGNOSIS — Z23 Encounter for immunization: Secondary | ICD-10-CM | POA: Diagnosis not present

## 2017-11-19 DIAGNOSIS — L820 Inflamed seborrheic keratosis: Secondary | ICD-10-CM | POA: Diagnosis not present

## 2017-11-19 DIAGNOSIS — D2262 Melanocytic nevi of left upper limb, including shoulder: Secondary | ICD-10-CM | POA: Diagnosis not present

## 2017-11-19 DIAGNOSIS — L570 Actinic keratosis: Secondary | ICD-10-CM | POA: Diagnosis not present

## 2017-11-19 DIAGNOSIS — D692 Other nonthrombocytopenic purpura: Secondary | ICD-10-CM | POA: Diagnosis not present

## 2017-11-19 DIAGNOSIS — D2271 Melanocytic nevi of right lower limb, including hip: Secondary | ICD-10-CM | POA: Diagnosis not present

## 2017-11-19 DIAGNOSIS — Z85828 Personal history of other malignant neoplasm of skin: Secondary | ICD-10-CM | POA: Diagnosis not present

## 2017-11-19 DIAGNOSIS — L821 Other seborrheic keratosis: Secondary | ICD-10-CM | POA: Diagnosis not present

## 2017-11-19 DIAGNOSIS — L814 Other melanin hyperpigmentation: Secondary | ICD-10-CM | POA: Diagnosis not present

## 2017-11-19 DIAGNOSIS — D2272 Melanocytic nevi of left lower limb, including hip: Secondary | ICD-10-CM | POA: Diagnosis not present

## 2017-11-19 DIAGNOSIS — L280 Lichen simplex chronicus: Secondary | ICD-10-CM | POA: Diagnosis not present

## 2017-12-09 DIAGNOSIS — H2513 Age-related nuclear cataract, bilateral: Secondary | ICD-10-CM | POA: Diagnosis not present

## 2017-12-09 DIAGNOSIS — H18413 Arcus senilis, bilateral: Secondary | ICD-10-CM | POA: Diagnosis not present

## 2017-12-09 DIAGNOSIS — H25013 Cortical age-related cataract, bilateral: Secondary | ICD-10-CM | POA: Diagnosis not present

## 2017-12-09 DIAGNOSIS — H2511 Age-related nuclear cataract, right eye: Secondary | ICD-10-CM | POA: Diagnosis not present

## 2017-12-09 DIAGNOSIS — H02831 Dermatochalasis of right upper eyelid: Secondary | ICD-10-CM | POA: Diagnosis not present

## 2017-12-09 DIAGNOSIS — H25043 Posterior subcapsular polar age-related cataract, bilateral: Secondary | ICD-10-CM | POA: Diagnosis not present

## 2018-01-13 DIAGNOSIS — L309 Dermatitis, unspecified: Secondary | ICD-10-CM | POA: Diagnosis not present

## 2018-01-13 DIAGNOSIS — N952 Postmenopausal atrophic vaginitis: Secondary | ICD-10-CM | POA: Diagnosis not present

## 2018-01-15 DIAGNOSIS — W19XXXA Unspecified fall, initial encounter: Secondary | ICD-10-CM | POA: Diagnosis not present

## 2018-01-15 DIAGNOSIS — S0990XA Unspecified injury of head, initial encounter: Secondary | ICD-10-CM | POA: Diagnosis not present

## 2018-01-19 DIAGNOSIS — H2511 Age-related nuclear cataract, right eye: Secondary | ICD-10-CM | POA: Diagnosis not present

## 2018-01-20 DIAGNOSIS — H2512 Age-related nuclear cataract, left eye: Secondary | ICD-10-CM | POA: Diagnosis not present

## 2018-01-28 ENCOUNTER — Encounter (HOSPITAL_COMMUNITY): Payer: Self-pay

## 2018-01-28 ENCOUNTER — Emergency Department (HOSPITAL_COMMUNITY)
Admission: EM | Admit: 2018-01-28 | Discharge: 2018-01-29 | Disposition: A | Discharge status: Home / Self Care | Payer: Medicare Other | Attending: Emergency Medicine | Admitting: Emergency Medicine

## 2018-01-28 DIAGNOSIS — W01110A Fall on same level from slipping, tripping and stumbling with subsequent striking against sharp glass, initial encounter: Secondary | ICD-10-CM | POA: Diagnosis not present

## 2018-01-28 DIAGNOSIS — Y999 Unspecified external cause status: Secondary | ICD-10-CM | POA: Diagnosis not present

## 2018-01-28 DIAGNOSIS — S61011A Laceration without foreign body of right thumb without damage to nail, initial encounter: Secondary | ICD-10-CM | POA: Insufficient documentation

## 2018-01-28 DIAGNOSIS — S6991XA Unspecified injury of right wrist, hand and finger(s), initial encounter: Secondary | ICD-10-CM | POA: Diagnosis present

## 2018-01-28 DIAGNOSIS — Y929 Unspecified place or not applicable: Secondary | ICD-10-CM | POA: Insufficient documentation

## 2018-01-28 DIAGNOSIS — Y939 Activity, unspecified: Secondary | ICD-10-CM | POA: Insufficient documentation

## 2018-01-28 NOTE — ED Triage Notes (Signed)
Pt states she was carrying glasses and tripped over something and the glass broke in her hand, she has a laceration on her right thumb, bleeding is not controlled

## 2018-01-29 ENCOUNTER — Other Ambulatory Visit: Payer: Self-pay

## 2018-01-29 DIAGNOSIS — S61011A Laceration without foreign body of right thumb without damage to nail, initial encounter: Secondary | ICD-10-CM | POA: Diagnosis not present

## 2018-01-29 MED ORDER — BACITRACIN ZINC 500 UNIT/GM EX OINT
TOPICAL_OINTMENT | CUTANEOUS | Status: AC
  Administered 2018-01-29: 03:00:00
  Filled 2018-01-29: qty 0.9

## 2018-01-29 MED ORDER — TETANUS-DIPHTH-ACELL PERTUSSIS 5-2.5-18.5 LF-MCG/0.5 IM SUSP
0.5000 mL | Freq: Once | INTRAMUSCULAR | Status: AC
  Administered 2018-01-29: 0.5 mL via INTRAMUSCULAR
  Filled 2018-01-29: qty 0.5

## 2018-01-29 MED ORDER — LIDOCAINE HCL 2 % IJ SOLN
10.0000 mL | Freq: Once | INTRAMUSCULAR | Status: AC
  Administered 2018-01-29: 200 mg
  Filled 2018-01-29: qty 20

## 2018-01-29 NOTE — Discharge Instructions (Signed)
Please wear the brace on your thumb to avoid ripping out your sutures.   Cean the wound with warm soap and water at least once per day. Then, cover the wound with a topical antibiotic like bacitracin or neosporin then apply a new bandage.   Please return to have your sutures removed in 7 days.  You can also go to your primary care provider or to urgent care.  Your Tdap was updated today.   You may take 600 mg of ibuprofen with food every 6 hours as needed for pain and inflammation control. You may apply ice for 15-20 minutes up to 3-4 times a day to help with pain and inflammation.   If the skin of the thumb or hand becomes red, hot, or swollen, please return to the emergency department for a wound recheck.

## 2018-01-29 NOTE — ED Provider Notes (Signed)
[facility] DEPT Provider Note   CSN: [id number] Arrival date & time: 01/28/18  2229     History   Chief Complaint Chief Complaint  Patient presents with  . thumb laceration    HPI Jasmine Wilkins is a 78 y.o. female with a history of cataract surgery who presents to the emergency department with a chief complaint of right thumb laceration earlier tonight.  The patient reports that she tripped over a stool and hit an empty wine glass, which shattered and cut her right thumb.  She reports that she tried to apply pressure at home, but was unable to get the bleeding to stop.  She denies numbness or weakness.  No history of right hand or thumb injury or surgery.  She is unsure of her tetanus status.  The history is provided by the patient.    History reviewed. No pertinent past medical history.  There are no active problems to display for this patient.   History reviewed. No pertinent surgical history.   OB History   No obstetric history on file.      Home Medications    Prior to Admission medications   Not on File    Family History History reviewed. No pertinent family history.  Social History Social History   Tobacco Use  . Smoking status: Never Smoker  . Smokeless tobacco: Never Used  Substance Use Topics  . Alcohol use: Never    Frequency: Never  . Drug use: Never     Allergies   Patient has no allergy information on record.   Review of Systems Review of Systems  Constitutional: Negative for activity change, chills and fever.  Respiratory: Negative for shortness of breath.   Cardiovascular: Negative for chest pain.  Gastrointestinal: Negative for abdominal pain.  Musculoskeletal: Negative for back pain.  Skin: Positive for wound. Negative for color change and rash.  Neurological: Negative for weakness and numbness.     Physical Exam Updated Vital Signs BP 133/89 (BP Location: Left Arm)   Pulse 79   Temp (!)  97.5 F (36.4 C) (Oral)   Resp 20   SpO2 98%   Physical Exam Vitals signs and nursing note reviewed.  Constitutional:      General: She is not in acute distress. HENT:     Head: Normocephalic.  Eyes:     Conjunctiva/sclera: Conjunctivae normal.  Neck:     Musculoskeletal: Neck supple.  Cardiovascular:     Rate and Rhythm: Normal rate and regular rhythm.     Heart sounds: No murmur. No friction rub. No gallop.   Pulmonary:     Effort: Pulmonary effort is normal. No respiratory distress.  Abdominal:     General: There is no distension.     Palpations: Abdomen is soft.  Musculoskeletal:     Comments: 1.5 cm laceration noted to the palmar surface of the right thumb.  Wound is superficial and is now hemostatic after direct pressure has been applied.  Wound is cleaned and without evidence of foreign body.  Full active and passive range of motion of the right thumb.  Radial pulses are 2+ and symmetric.  Sensation is intact throughout all 4 aspects of the distal tip of the right thumb.  5/5 strength against resistance with dorsiflexion plantarflexion.  Skin:    General: Skin is warm.     Findings: No rash.  Neurological:     Mental Status: She is alert.  Psychiatric:  Behavior: Behavior normal.      ED Treatments / Results  Labs (all labs ordered are listed, but only abnormal results are displayed) Labs Reviewed - No data to display  EKG None  Radiology No results found.  Procedures .Marland KitchenLaceration Repair Date/Time: 01/29/2018 2:48 AM Performed by: Joanne Gavel, PA-C Authorized by: Joanne Gavel, PA-C   Consent:    Consent obtained:  Verbal   Consent given by:  Patient   Risks discussed:  Infection, pain, poor cosmetic result, poor wound healing, need for additional repair and nerve damage   Alternatives discussed:  No treatment Anesthesia (see MAR for exact dosages):    Anesthesia method:  Local infiltration   Local anesthetic:  Lidocaine 2% w/o  epi Laceration details:    Location:  Finger   Finger location:  R thumb   Length (cm):  1.5 Repair type:    Repair type:  Simple Pre-procedure details:    Preparation:  Patient was prepped and draped in usual sterile fashion Exploration:    Hemostasis achieved with:  Direct pressure   Wound exploration: wound explored through full range of motion and entire depth of wound probed and visualized     Wound extent: no fascia violation noted, no foreign bodies/material noted, no muscle damage noted, no nerve damage noted, no tendon damage noted, no underlying fracture noted and no vascular damage noted     Contaminated: no   Treatment:    Area cleansed with:  Saline   Amount of cleaning:  Extensive   Irrigation solution:  Sterile saline   Irrigation method:  Pressure wash Skin repair:    Repair method:  Sutures   Suture size:  4-0   Suture material:  Prolene   Suture technique: 1 simple interrupted; 1 horizontal mattress.   Number of sutures:  2 Approximation:    Approximation:  Close Post-procedure details:    Dressing:  Antibiotic ointment, splint for protection and sterile dressing   Patient tolerance of procedure:  Tolerated well, no immediate complications   (including critical care time)  Medications Ordered in ED Medications  lidocaine (XYLOCAINE) 2 % (with pres) injection 200 mg (0 mg Infiltration Hold 01/29/18 0136)  Tdap (BOOSTRIX) injection 0.5 mL (0.5 mLs Intramuscular Given 01/29/18 0134)     Initial Impression / Assessment and Plan / ED Course  I have reviewed the triage vital signs and the nursing notes.  Pertinent labs & imaging results that were available during my care of the patient were reviewed by me and considered in my medical decision making (see chart for details).     46-year female with a history of cataract surgery presenting with a right thumb laceration that occurred tonight.  She is neurovascularly intact and wound is hemostatic after direct  pressure has been applied. Tdap booster given. Pressure irrigation performed. Laceration occurred < 8 hours prior to repair which was well tolerated. Pt has no co morbidities to effect normal wound healing. Discussed suture home care w pt and answered questions. Pt to f-u for wound check and suture removal in 7 days. Pt is hemodynamically stable w no complaints prior to dc.    Final Clinical Impressions(s) / ED Diagnoses   Final diagnoses:  Laceration of right thumb without foreign body without damage to nail, initial encounter    ED Discharge Orders    None       Joanne Gavel, PA-C 01/29/18 [place], Ankit, MD 01/30/18 [phone]

## 2018-02-05 DIAGNOSIS — S61019D Laceration without foreign body of unspecified thumb without damage to nail, subsequent encounter: Secondary | ICD-10-CM | POA: Diagnosis not present

## 2018-02-05 DIAGNOSIS — Z4802 Encounter for removal of sutures: Secondary | ICD-10-CM | POA: Diagnosis not present

## 2018-02-09 DIAGNOSIS — H2512 Age-related nuclear cataract, left eye: Secondary | ICD-10-CM | POA: Diagnosis not present

## 2018-03-09 DIAGNOSIS — L931 Subacute cutaneous lupus erythematosus: Secondary | ICD-10-CM | POA: Diagnosis not present

## 2018-03-09 DIAGNOSIS — E782 Mixed hyperlipidemia: Secondary | ICD-10-CM | POA: Diagnosis not present

## 2018-03-09 DIAGNOSIS — E46 Unspecified protein-calorie malnutrition: Secondary | ICD-10-CM | POA: Diagnosis not present

## 2018-03-09 DIAGNOSIS — Z0000 Encounter for general adult medical examination without abnormal findings: Secondary | ICD-10-CM | POA: Diagnosis not present

## 2018-03-09 DIAGNOSIS — E039 Hypothyroidism, unspecified: Secondary | ICD-10-CM | POA: Diagnosis not present

## 2018-03-09 DIAGNOSIS — H04129 Dry eye syndrome of unspecified lacrimal gland: Secondary | ICD-10-CM | POA: Diagnosis not present

## 2018-03-09 DIAGNOSIS — M8580 Other specified disorders of bone density and structure, unspecified site: Secondary | ICD-10-CM | POA: Diagnosis not present

## 2018-03-18 DIAGNOSIS — R768 Other specified abnormal immunological findings in serum: Secondary | ICD-10-CM | POA: Diagnosis not present

## 2018-03-18 DIAGNOSIS — M3500 Sicca syndrome, unspecified: Secondary | ICD-10-CM | POA: Diagnosis not present

## 2018-03-18 DIAGNOSIS — L931 Subacute cutaneous lupus erythematosus: Secondary | ICD-10-CM | POA: Diagnosis not present

## 2018-03-18 DIAGNOSIS — Z681 Body mass index (BMI) 19 or less, adult: Secondary | ICD-10-CM | POA: Diagnosis not present

## 2018-06-02 DIAGNOSIS — S51801A Unspecified open wound of right forearm, initial encounter: Secondary | ICD-10-CM | POA: Diagnosis not present

## 2018-06-02 DIAGNOSIS — W19XXXA Unspecified fall, initial encounter: Secondary | ICD-10-CM | POA: Diagnosis not present

## 2018-06-04 DIAGNOSIS — S51801D Unspecified open wound of right forearm, subsequent encounter: Secondary | ICD-10-CM | POA: Diagnosis not present

## 2018-09-16 DIAGNOSIS — M3500 Sicca syndrome, unspecified: Secondary | ICD-10-CM | POA: Diagnosis not present

## 2018-09-16 DIAGNOSIS — L931 Subacute cutaneous lupus erythematosus: Secondary | ICD-10-CM | POA: Diagnosis not present

## 2018-09-16 DIAGNOSIS — Z681 Body mass index (BMI) 19 or less, adult: Secondary | ICD-10-CM | POA: Diagnosis not present

## 2018-09-16 DIAGNOSIS — F439 Reaction to severe stress, unspecified: Secondary | ICD-10-CM | POA: Diagnosis not present

## 2018-09-16 DIAGNOSIS — R768 Other specified abnormal immunological findings in serum: Secondary | ICD-10-CM | POA: Diagnosis not present

## 2018-12-01 DIAGNOSIS — Z23 Encounter for immunization: Secondary | ICD-10-CM | POA: Diagnosis not present

## 2018-12-25 ENCOUNTER — Ambulatory Visit: Payer: Medicare Other | Attending: Internal Medicine

## 2018-12-25 DIAGNOSIS — Z20822 Contact with and (suspected) exposure to covid-19: Secondary | ICD-10-CM

## 2018-12-26 LAB — NOVEL CORONAVIRUS, NAA: SARS-CoV-2, NAA: NOT DETECTED

## 2019-01-20 DIAGNOSIS — L309 Dermatitis, unspecified: Secondary | ICD-10-CM | POA: Diagnosis not present

## 2019-03-16 DIAGNOSIS — L931 Subacute cutaneous lupus erythematosus: Secondary | ICD-10-CM | POA: Diagnosis not present

## 2019-03-16 DIAGNOSIS — M7551 Bursitis of right shoulder: Secondary | ICD-10-CM | POA: Diagnosis not present

## 2019-03-16 DIAGNOSIS — Z681 Body mass index (BMI) 19 or less, adult: Secondary | ICD-10-CM | POA: Diagnosis not present

## 2019-03-16 DIAGNOSIS — M3500 Sicca syndrome, unspecified: Secondary | ICD-10-CM | POA: Diagnosis not present

## 2019-03-16 DIAGNOSIS — K52832 Lymphocytic colitis: Secondary | ICD-10-CM | POA: Diagnosis not present

## 2019-03-17 DIAGNOSIS — E782 Mixed hyperlipidemia: Secondary | ICD-10-CM | POA: Diagnosis not present

## 2019-03-17 DIAGNOSIS — M8580 Other specified disorders of bone density and structure, unspecified site: Secondary | ICD-10-CM | POA: Diagnosis not present

## 2019-03-17 DIAGNOSIS — K52832 Lymphocytic colitis: Secondary | ICD-10-CM | POA: Diagnosis not present

## 2019-03-17 DIAGNOSIS — E039 Hypothyroidism, unspecified: Secondary | ICD-10-CM | POA: Diagnosis not present

## 2019-03-17 DIAGNOSIS — H04129 Dry eye syndrome of unspecified lacrimal gland: Secondary | ICD-10-CM | POA: Diagnosis not present

## 2019-03-17 DIAGNOSIS — Z0000 Encounter for general adult medical examination without abnormal findings: Secondary | ICD-10-CM | POA: Diagnosis not present

## 2019-03-17 DIAGNOSIS — L931 Subacute cutaneous lupus erythematosus: Secondary | ICD-10-CM | POA: Diagnosis not present

## 2019-03-17 DIAGNOSIS — E46 Unspecified protein-calorie malnutrition: Secondary | ICD-10-CM | POA: Diagnosis not present

## 2019-03-18 ENCOUNTER — Other Ambulatory Visit: Payer: Self-pay | Admitting: Family Medicine

## 2019-03-18 DIAGNOSIS — Z1231 Encounter for screening mammogram for malignant neoplasm of breast: Secondary | ICD-10-CM

## 2019-03-18 DIAGNOSIS — M8580 Other specified disorders of bone density and structure, unspecified site: Secondary | ICD-10-CM

## 2019-06-21 DIAGNOSIS — S90812S Abrasion, left foot, sequela: Secondary | ICD-10-CM | POA: Diagnosis not present

## 2019-08-09 DIAGNOSIS — D485 Neoplasm of uncertain behavior of skin: Secondary | ICD-10-CM | POA: Diagnosis not present

## 2019-08-09 DIAGNOSIS — C44719 Basal cell carcinoma of skin of left lower limb, including hip: Secondary | ICD-10-CM | POA: Diagnosis not present

## 2019-08-09 DIAGNOSIS — C44619 Basal cell carcinoma of skin of left upper limb, including shoulder: Secondary | ICD-10-CM | POA: Diagnosis not present

## 2019-09-06 DIAGNOSIS — H43813 Vitreous degeneration, bilateral: Secondary | ICD-10-CM | POA: Diagnosis not present

## 2019-11-11 DIAGNOSIS — Z23 Encounter for immunization: Secondary | ICD-10-CM | POA: Diagnosis not present

## 2020-03-22 DIAGNOSIS — R4189 Other symptoms and signs involving cognitive functions and awareness: Secondary | ICD-10-CM | POA: Diagnosis not present

## 2020-03-22 DIAGNOSIS — K52832 Lymphocytic colitis: Secondary | ICD-10-CM | POA: Diagnosis not present

## 2020-03-22 DIAGNOSIS — H04129 Dry eye syndrome of unspecified lacrimal gland: Secondary | ICD-10-CM | POA: Diagnosis not present

## 2020-03-22 DIAGNOSIS — F4321 Adjustment disorder with depressed mood: Secondary | ICD-10-CM | POA: Diagnosis not present

## 2020-03-22 DIAGNOSIS — E782 Mixed hyperlipidemia: Secondary | ICD-10-CM | POA: Diagnosis not present

## 2020-03-22 DIAGNOSIS — E039 Hypothyroidism, unspecified: Secondary | ICD-10-CM | POA: Diagnosis not present

## 2020-03-22 DIAGNOSIS — L931 Subacute cutaneous lupus erythematosus: Secondary | ICD-10-CM | POA: Diagnosis not present

## 2020-03-22 DIAGNOSIS — M8580 Other specified disorders of bone density and structure, unspecified site: Secondary | ICD-10-CM | POA: Diagnosis not present

## 2020-03-22 DIAGNOSIS — Z0000 Encounter for general adult medical examination without abnormal findings: Secondary | ICD-10-CM | POA: Diagnosis not present

## 2020-03-22 DIAGNOSIS — E46 Unspecified protein-calorie malnutrition: Secondary | ICD-10-CM | POA: Diagnosis not present

## 2020-03-23 DIAGNOSIS — R319 Hematuria, unspecified: Secondary | ICD-10-CM | POA: Diagnosis not present

## 2020-03-24 ENCOUNTER — Other Ambulatory Visit: Payer: Self-pay | Admitting: Family Medicine

## 2020-03-24 DIAGNOSIS — M8580 Other specified disorders of bone density and structure, unspecified site: Secondary | ICD-10-CM

## 2020-03-24 DIAGNOSIS — Z1231 Encounter for screening mammogram for malignant neoplasm of breast: Secondary | ICD-10-CM

## 2020-03-28 ENCOUNTER — Ambulatory Visit: Payer: Medicare Other

## 2020-04-12 DIAGNOSIS — R319 Hematuria, unspecified: Secondary | ICD-10-CM | POA: Diagnosis not present

## 2020-05-18 ENCOUNTER — Ambulatory Visit
Admission: RE | Admit: 2020-05-18 | Discharge: 2020-05-18 | Disposition: A | Discharge status: Home / Self Care | Payer: Medicare Other | Source: Ambulatory Visit | Attending: Family Medicine | Admitting: Family Medicine

## 2020-05-18 ENCOUNTER — Other Ambulatory Visit: Payer: Self-pay

## 2020-05-18 DIAGNOSIS — Z1231 Encounter for screening mammogram for malignant neoplasm of breast: Secondary | ICD-10-CM | POA: Diagnosis not present

## 2020-05-23 DIAGNOSIS — F4321 Adjustment disorder with depressed mood: Secondary | ICD-10-CM | POA: Diagnosis not present

## 2020-05-23 DIAGNOSIS — E46 Unspecified protein-calorie malnutrition: Secondary | ICD-10-CM | POA: Diagnosis not present

## 2020-05-23 DIAGNOSIS — R413 Other amnesia: Secondary | ICD-10-CM | POA: Diagnosis not present

## 2020-10-12 DIAGNOSIS — Z23 Encounter for immunization: Secondary | ICD-10-CM | POA: Diagnosis not present

## 2020-11-27 DIAGNOSIS — H43813 Vitreous degeneration, bilateral: Secondary | ICD-10-CM | POA: Diagnosis not present

## 2020-11-27 DIAGNOSIS — Z961 Presence of intraocular lens: Secondary | ICD-10-CM | POA: Diagnosis not present

## 2020-11-27 DIAGNOSIS — H26493 Other secondary cataract, bilateral: Secondary | ICD-10-CM | POA: Diagnosis not present

## 2020-12-22 ENCOUNTER — Ambulatory Visit
Admission: RE | Admit: 2020-12-22 | Discharge: 2020-12-22 | Disposition: A | Discharge status: Home / Self Care | Payer: Medicare Other | Source: Ambulatory Visit | Attending: Family Medicine | Admitting: Family Medicine

## 2020-12-22 DIAGNOSIS — Z780 Asymptomatic menopausal state: Secondary | ICD-10-CM | POA: Diagnosis not present

## 2020-12-22 DIAGNOSIS — M8580 Other specified disorders of bone density and structure, unspecified site: Secondary | ICD-10-CM

## 2020-12-22 DIAGNOSIS — M810 Age-related osteoporosis without current pathological fracture: Secondary | ICD-10-CM | POA: Diagnosis not present

## 2020-12-22 DIAGNOSIS — M85852 Other specified disorders of bone density and structure, left thigh: Secondary | ICD-10-CM | POA: Diagnosis not present

## 2021-02-28 DIAGNOSIS — L308 Other specified dermatitis: Secondary | ICD-10-CM | POA: Diagnosis not present

## 2021-02-28 DIAGNOSIS — Z85828 Personal history of other malignant neoplasm of skin: Secondary | ICD-10-CM | POA: Diagnosis not present

## 2021-04-04 DIAGNOSIS — L931 Subacute cutaneous lupus erythematosus: Secondary | ICD-10-CM | POA: Diagnosis not present

## 2021-04-04 DIAGNOSIS — Z0000 Encounter for general adult medical examination without abnormal findings: Secondary | ICD-10-CM | POA: Diagnosis not present

## 2021-04-04 DIAGNOSIS — E039 Hypothyroidism, unspecified: Secondary | ICD-10-CM | POA: Diagnosis not present

## 2021-04-04 DIAGNOSIS — M8580 Other specified disorders of bone density and structure, unspecified site: Secondary | ICD-10-CM | POA: Diagnosis not present

## 2021-04-04 DIAGNOSIS — H04129 Dry eye syndrome of unspecified lacrimal gland: Secondary | ICD-10-CM | POA: Diagnosis not present

## 2021-04-04 DIAGNOSIS — R350 Frequency of micturition: Secondary | ICD-10-CM | POA: Diagnosis not present

## 2021-04-04 DIAGNOSIS — R413 Other amnesia: Secondary | ICD-10-CM | POA: Diagnosis not present

## 2021-04-04 DIAGNOSIS — E46 Unspecified protein-calorie malnutrition: Secondary | ICD-10-CM | POA: Diagnosis not present

## 2021-04-04 DIAGNOSIS — E782 Mixed hyperlipidemia: Secondary | ICD-10-CM | POA: Diagnosis not present

## 2021-06-08 DIAGNOSIS — F329 Major depressive disorder, single episode, unspecified: Secondary | ICD-10-CM | POA: Diagnosis not present

## 2021-06-08 DIAGNOSIS — F02A3 Dementia in other diseases classified elsewhere, mild, with mood disturbance: Secondary | ICD-10-CM | POA: Diagnosis not present

## 2021-06-08 DIAGNOSIS — G301 Alzheimer's disease with late onset: Secondary | ICD-10-CM | POA: Diagnosis not present

## 2021-06-08 DIAGNOSIS — F322 Major depressive disorder, single episode, severe without psychotic features: Secondary | ICD-10-CM | POA: Diagnosis not present

## 2022-01-21 DIAGNOSIS — R41841 Cognitive communication deficit: Secondary | ICD-10-CM | POA: Diagnosis not present

## 2022-01-21 DIAGNOSIS — M3500 Sicca syndrome, unspecified: Secondary | ICD-10-CM | POA: Diagnosis not present

## 2022-01-21 DIAGNOSIS — R278 Other lack of coordination: Secondary | ICD-10-CM | POA: Diagnosis not present

## 2022-01-21 DIAGNOSIS — L931 Subacute cutaneous lupus erythematosus: Secondary | ICD-10-CM | POA: Diagnosis not present

## 2022-01-21 DIAGNOSIS — M6281 Muscle weakness (generalized): Secondary | ICD-10-CM | POA: Diagnosis not present

## 2022-01-21 DIAGNOSIS — G309 Alzheimer's disease, unspecified: Secondary | ICD-10-CM | POA: Diagnosis not present

## 2022-01-21 DIAGNOSIS — E782 Mixed hyperlipidemia: Secondary | ICD-10-CM | POA: Diagnosis not present

## 2022-01-21 DIAGNOSIS — E039 Hypothyroidism, unspecified: Secondary | ICD-10-CM | POA: Diagnosis not present

## 2022-01-22 DIAGNOSIS — M6281 Muscle weakness (generalized): Secondary | ICD-10-CM | POA: Diagnosis not present

## 2022-01-22 DIAGNOSIS — E039 Hypothyroidism, unspecified: Secondary | ICD-10-CM | POA: Diagnosis not present

## 2022-01-22 DIAGNOSIS — R278 Other lack of coordination: Secondary | ICD-10-CM | POA: Diagnosis not present

## 2022-01-22 DIAGNOSIS — R41841 Cognitive communication deficit: Secondary | ICD-10-CM | POA: Diagnosis not present

## 2022-01-22 DIAGNOSIS — M3500 Sicca syndrome, unspecified: Secondary | ICD-10-CM | POA: Diagnosis not present

## 2022-01-22 DIAGNOSIS — E782 Mixed hyperlipidemia: Secondary | ICD-10-CM | POA: Diagnosis not present

## 2022-01-29 DIAGNOSIS — M3500 Sicca syndrome, unspecified: Secondary | ICD-10-CM | POA: Diagnosis not present

## 2022-01-29 DIAGNOSIS — R278 Other lack of coordination: Secondary | ICD-10-CM | POA: Diagnosis not present

## 2022-01-29 DIAGNOSIS — E782 Mixed hyperlipidemia: Secondary | ICD-10-CM | POA: Diagnosis not present

## 2022-01-29 DIAGNOSIS — E039 Hypothyroidism, unspecified: Secondary | ICD-10-CM | POA: Diagnosis not present

## 2022-01-29 DIAGNOSIS — M6281 Muscle weakness (generalized): Secondary | ICD-10-CM | POA: Diagnosis not present

## 2022-01-29 DIAGNOSIS — R41841 Cognitive communication deficit: Secondary | ICD-10-CM | POA: Diagnosis not present

## 2022-01-30 DIAGNOSIS — M3500 Sicca syndrome, unspecified: Secondary | ICD-10-CM | POA: Diagnosis not present

## 2022-01-30 DIAGNOSIS — E782 Mixed hyperlipidemia: Secondary | ICD-10-CM | POA: Diagnosis not present

## 2022-01-30 DIAGNOSIS — M6281 Muscle weakness (generalized): Secondary | ICD-10-CM | POA: Diagnosis not present

## 2022-01-30 DIAGNOSIS — R41841 Cognitive communication deficit: Secondary | ICD-10-CM | POA: Diagnosis not present

## 2022-01-30 DIAGNOSIS — E039 Hypothyroidism, unspecified: Secondary | ICD-10-CM | POA: Diagnosis not present

## 2022-01-30 DIAGNOSIS — R278 Other lack of coordination: Secondary | ICD-10-CM | POA: Diagnosis not present

## 2022-02-01 DIAGNOSIS — R278 Other lack of coordination: Secondary | ICD-10-CM | POA: Diagnosis not present

## 2022-02-01 DIAGNOSIS — E782 Mixed hyperlipidemia: Secondary | ICD-10-CM | POA: Diagnosis not present

## 2022-02-01 DIAGNOSIS — E039 Hypothyroidism, unspecified: Secondary | ICD-10-CM | POA: Diagnosis not present

## 2022-02-01 DIAGNOSIS — R41841 Cognitive communication deficit: Secondary | ICD-10-CM | POA: Diagnosis not present

## 2022-02-01 DIAGNOSIS — M6281 Muscle weakness (generalized): Secondary | ICD-10-CM | POA: Diagnosis not present

## 2022-02-01 DIAGNOSIS — M3500 Sicca syndrome, unspecified: Secondary | ICD-10-CM | POA: Diagnosis not present

## 2022-02-06 DIAGNOSIS — R278 Other lack of coordination: Secondary | ICD-10-CM | POA: Diagnosis not present

## 2022-02-06 DIAGNOSIS — M6281 Muscle weakness (generalized): Secondary | ICD-10-CM | POA: Diagnosis not present

## 2022-02-06 DIAGNOSIS — E782 Mixed hyperlipidemia: Secondary | ICD-10-CM | POA: Diagnosis not present

## 2022-02-06 DIAGNOSIS — R41841 Cognitive communication deficit: Secondary | ICD-10-CM | POA: Diagnosis not present

## 2022-02-06 DIAGNOSIS — E039 Hypothyroidism, unspecified: Secondary | ICD-10-CM | POA: Diagnosis not present

## 2022-02-06 DIAGNOSIS — M3500 Sicca syndrome, unspecified: Secondary | ICD-10-CM | POA: Diagnosis not present

## 2022-02-13 DIAGNOSIS — E782 Mixed hyperlipidemia: Secondary | ICD-10-CM | POA: Diagnosis not present

## 2022-02-13 DIAGNOSIS — M3500 Sicca syndrome, unspecified: Secondary | ICD-10-CM | POA: Diagnosis not present

## 2022-02-13 DIAGNOSIS — L931 Subacute cutaneous lupus erythematosus: Secondary | ICD-10-CM | POA: Diagnosis not present

## 2022-02-13 DIAGNOSIS — G309 Alzheimer's disease, unspecified: Secondary | ICD-10-CM | POA: Diagnosis not present

## 2022-02-13 DIAGNOSIS — E039 Hypothyroidism, unspecified: Secondary | ICD-10-CM | POA: Diagnosis not present

## 2022-02-13 DIAGNOSIS — R41841 Cognitive communication deficit: Secondary | ICD-10-CM | POA: Diagnosis not present

## 2022-02-22 DIAGNOSIS — E782 Mixed hyperlipidemia: Secondary | ICD-10-CM | POA: Diagnosis not present

## 2022-02-22 DIAGNOSIS — G309 Alzheimer's disease, unspecified: Secondary | ICD-10-CM | POA: Diagnosis not present

## 2022-02-22 DIAGNOSIS — M3500 Sicca syndrome, unspecified: Secondary | ICD-10-CM | POA: Diagnosis not present

## 2022-02-22 DIAGNOSIS — R41841 Cognitive communication deficit: Secondary | ICD-10-CM | POA: Diagnosis not present

## 2022-02-22 DIAGNOSIS — L931 Subacute cutaneous lupus erythematosus: Secondary | ICD-10-CM | POA: Diagnosis not present

## 2022-02-22 DIAGNOSIS — E039 Hypothyroidism, unspecified: Secondary | ICD-10-CM | POA: Diagnosis not present

## 2022-02-27 DIAGNOSIS — G309 Alzheimer's disease, unspecified: Secondary | ICD-10-CM | POA: Diagnosis not present

## 2022-02-27 DIAGNOSIS — R41841 Cognitive communication deficit: Secondary | ICD-10-CM | POA: Diagnosis not present

## 2022-02-27 DIAGNOSIS — E039 Hypothyroidism, unspecified: Secondary | ICD-10-CM | POA: Diagnosis not present

## 2022-02-27 DIAGNOSIS — M3500 Sicca syndrome, unspecified: Secondary | ICD-10-CM | POA: Diagnosis not present

## 2022-02-27 DIAGNOSIS — E782 Mixed hyperlipidemia: Secondary | ICD-10-CM | POA: Diagnosis not present

## 2022-02-27 DIAGNOSIS — L931 Subacute cutaneous lupus erythematosus: Secondary | ICD-10-CM | POA: Diagnosis not present

## 2022-02-28 DIAGNOSIS — R41841 Cognitive communication deficit: Secondary | ICD-10-CM | POA: Diagnosis not present

## 2022-02-28 DIAGNOSIS — E039 Hypothyroidism, unspecified: Secondary | ICD-10-CM | POA: Diagnosis not present

## 2022-02-28 DIAGNOSIS — E782 Mixed hyperlipidemia: Secondary | ICD-10-CM | POA: Diagnosis not present

## 2022-02-28 DIAGNOSIS — L931 Subacute cutaneous lupus erythematosus: Secondary | ICD-10-CM | POA: Diagnosis not present

## 2022-02-28 DIAGNOSIS — G309 Alzheimer's disease, unspecified: Secondary | ICD-10-CM | POA: Diagnosis not present

## 2022-02-28 DIAGNOSIS — M3500 Sicca syndrome, unspecified: Secondary | ICD-10-CM | POA: Diagnosis not present

## 2022-03-05 DIAGNOSIS — E782 Mixed hyperlipidemia: Secondary | ICD-10-CM | POA: Diagnosis not present

## 2022-03-05 DIAGNOSIS — G309 Alzheimer's disease, unspecified: Secondary | ICD-10-CM | POA: Diagnosis not present

## 2022-03-05 DIAGNOSIS — L931 Subacute cutaneous lupus erythematosus: Secondary | ICD-10-CM | POA: Diagnosis not present

## 2022-03-05 DIAGNOSIS — R41841 Cognitive communication deficit: Secondary | ICD-10-CM | POA: Diagnosis not present

## 2022-03-05 DIAGNOSIS — E039 Hypothyroidism, unspecified: Secondary | ICD-10-CM | POA: Diagnosis not present

## 2022-03-05 DIAGNOSIS — M3500 Sicca syndrome, unspecified: Secondary | ICD-10-CM | POA: Diagnosis not present

## 2022-03-12 DIAGNOSIS — E782 Mixed hyperlipidemia: Secondary | ICD-10-CM | POA: Diagnosis not present

## 2022-03-12 DIAGNOSIS — R41841 Cognitive communication deficit: Secondary | ICD-10-CM | POA: Diagnosis not present

## 2022-03-12 DIAGNOSIS — M3500 Sicca syndrome, unspecified: Secondary | ICD-10-CM | POA: Diagnosis not present

## 2022-03-12 DIAGNOSIS — L931 Subacute cutaneous lupus erythematosus: Secondary | ICD-10-CM | POA: Diagnosis not present

## 2022-03-12 DIAGNOSIS — G309 Alzheimer's disease, unspecified: Secondary | ICD-10-CM | POA: Diagnosis not present

## 2022-03-12 DIAGNOSIS — E039 Hypothyroidism, unspecified: Secondary | ICD-10-CM | POA: Diagnosis not present

## 2022-03-14 DIAGNOSIS — E782 Mixed hyperlipidemia: Secondary | ICD-10-CM | POA: Diagnosis not present

## 2022-03-14 DIAGNOSIS — R41841 Cognitive communication deficit: Secondary | ICD-10-CM | POA: Diagnosis not present

## 2022-03-14 DIAGNOSIS — M3500 Sicca syndrome, unspecified: Secondary | ICD-10-CM | POA: Diagnosis not present

## 2022-03-14 DIAGNOSIS — G309 Alzheimer's disease, unspecified: Secondary | ICD-10-CM | POA: Diagnosis not present

## 2022-03-14 DIAGNOSIS — L931 Subacute cutaneous lupus erythematosus: Secondary | ICD-10-CM | POA: Diagnosis not present

## 2022-03-14 DIAGNOSIS — E039 Hypothyroidism, unspecified: Secondary | ICD-10-CM | POA: Diagnosis not present

## 2022-03-19 DIAGNOSIS — R41841 Cognitive communication deficit: Secondary | ICD-10-CM | POA: Diagnosis not present

## 2022-03-19 DIAGNOSIS — E782 Mixed hyperlipidemia: Secondary | ICD-10-CM | POA: Diagnosis not present

## 2022-03-19 DIAGNOSIS — E039 Hypothyroidism, unspecified: Secondary | ICD-10-CM | POA: Diagnosis not present

## 2022-03-19 DIAGNOSIS — M3500 Sicca syndrome, unspecified: Secondary | ICD-10-CM | POA: Diagnosis not present

## 2022-03-19 DIAGNOSIS — L931 Subacute cutaneous lupus erythematosus: Secondary | ICD-10-CM | POA: Diagnosis not present

## 2022-03-19 DIAGNOSIS — G309 Alzheimer's disease, unspecified: Secondary | ICD-10-CM | POA: Diagnosis not present

## 2022-03-21 DIAGNOSIS — E782 Mixed hyperlipidemia: Secondary | ICD-10-CM | POA: Diagnosis not present

## 2022-03-21 DIAGNOSIS — M3500 Sicca syndrome, unspecified: Secondary | ICD-10-CM | POA: Diagnosis not present

## 2022-03-21 DIAGNOSIS — G309 Alzheimer's disease, unspecified: Secondary | ICD-10-CM | POA: Diagnosis not present

## 2022-03-21 DIAGNOSIS — R41841 Cognitive communication deficit: Secondary | ICD-10-CM | POA: Diagnosis not present

## 2022-03-21 DIAGNOSIS — E039 Hypothyroidism, unspecified: Secondary | ICD-10-CM | POA: Diagnosis not present

## 2022-03-21 DIAGNOSIS — L931 Subacute cutaneous lupus erythematosus: Secondary | ICD-10-CM | POA: Diagnosis not present

## 2022-03-26 DIAGNOSIS — G309 Alzheimer's disease, unspecified: Secondary | ICD-10-CM | POA: Diagnosis not present

## 2022-03-26 DIAGNOSIS — M3500 Sicca syndrome, unspecified: Secondary | ICD-10-CM | POA: Diagnosis not present

## 2022-03-26 DIAGNOSIS — L931 Subacute cutaneous lupus erythematosus: Secondary | ICD-10-CM | POA: Diagnosis not present

## 2022-03-26 DIAGNOSIS — R41841 Cognitive communication deficit: Secondary | ICD-10-CM | POA: Diagnosis not present

## 2022-03-26 DIAGNOSIS — E782 Mixed hyperlipidemia: Secondary | ICD-10-CM | POA: Diagnosis not present

## 2022-03-26 DIAGNOSIS — E039 Hypothyroidism, unspecified: Secondary | ICD-10-CM | POA: Diagnosis not present

## 2022-03-28 DIAGNOSIS — L931 Subacute cutaneous lupus erythematosus: Secondary | ICD-10-CM | POA: Diagnosis not present

## 2022-03-28 DIAGNOSIS — E039 Hypothyroidism, unspecified: Secondary | ICD-10-CM | POA: Diagnosis not present

## 2022-03-28 DIAGNOSIS — E782 Mixed hyperlipidemia: Secondary | ICD-10-CM | POA: Diagnosis not present

## 2022-03-28 DIAGNOSIS — R41841 Cognitive communication deficit: Secondary | ICD-10-CM | POA: Diagnosis not present

## 2022-03-28 DIAGNOSIS — M3500 Sicca syndrome, unspecified: Secondary | ICD-10-CM | POA: Diagnosis not present

## 2022-03-28 DIAGNOSIS — G309 Alzheimer's disease, unspecified: Secondary | ICD-10-CM | POA: Diagnosis not present

## 2022-04-03 DIAGNOSIS — G309 Alzheimer's disease, unspecified: Secondary | ICD-10-CM | POA: Diagnosis not present

## 2022-04-03 DIAGNOSIS — M3500 Sicca syndrome, unspecified: Secondary | ICD-10-CM | POA: Diagnosis not present

## 2022-04-03 DIAGNOSIS — L931 Subacute cutaneous lupus erythematosus: Secondary | ICD-10-CM | POA: Diagnosis not present

## 2022-04-03 DIAGNOSIS — E782 Mixed hyperlipidemia: Secondary | ICD-10-CM | POA: Diagnosis not present

## 2022-04-03 DIAGNOSIS — E039 Hypothyroidism, unspecified: Secondary | ICD-10-CM | POA: Diagnosis not present

## 2022-04-03 DIAGNOSIS — R41841 Cognitive communication deficit: Secondary | ICD-10-CM | POA: Diagnosis not present

## 2022-04-04 DIAGNOSIS — M3500 Sicca syndrome, unspecified: Secondary | ICD-10-CM | POA: Diagnosis not present

## 2022-04-04 DIAGNOSIS — R41841 Cognitive communication deficit: Secondary | ICD-10-CM | POA: Diagnosis not present

## 2022-04-04 DIAGNOSIS — E782 Mixed hyperlipidemia: Secondary | ICD-10-CM | POA: Diagnosis not present

## 2022-04-04 DIAGNOSIS — G309 Alzheimer's disease, unspecified: Secondary | ICD-10-CM | POA: Diagnosis not present

## 2022-04-04 DIAGNOSIS — E039 Hypothyroidism, unspecified: Secondary | ICD-10-CM | POA: Diagnosis not present

## 2022-04-04 DIAGNOSIS — L931 Subacute cutaneous lupus erythematosus: Secondary | ICD-10-CM | POA: Diagnosis not present

## 2022-04-10 DIAGNOSIS — L931 Subacute cutaneous lupus erythematosus: Secondary | ICD-10-CM | POA: Diagnosis not present

## 2022-04-10 DIAGNOSIS — R300 Dysuria: Secondary | ICD-10-CM | POA: Diagnosis not present

## 2022-04-10 DIAGNOSIS — Z0000 Encounter for general adult medical examination without abnormal findings: Secondary | ICD-10-CM | POA: Diagnosis not present

## 2022-04-10 DIAGNOSIS — H04129 Dry eye syndrome of unspecified lacrimal gland: Secondary | ICD-10-CM | POA: Diagnosis not present

## 2022-04-10 DIAGNOSIS — M8580 Other specified disorders of bone density and structure, unspecified site: Secondary | ICD-10-CM | POA: Diagnosis not present

## 2022-04-10 DIAGNOSIS — G309 Alzheimer's disease, unspecified: Secondary | ICD-10-CM | POA: Diagnosis not present

## 2022-04-10 DIAGNOSIS — E039 Hypothyroidism, unspecified: Secondary | ICD-10-CM | POA: Diagnosis not present

## 2022-04-10 DIAGNOSIS — E782 Mixed hyperlipidemia: Secondary | ICD-10-CM | POA: Diagnosis not present

## 2022-04-10 DIAGNOSIS — E46 Unspecified protein-calorie malnutrition: Secondary | ICD-10-CM | POA: Diagnosis not present

## 2022-04-11 DIAGNOSIS — E039 Hypothyroidism, unspecified: Secondary | ICD-10-CM | POA: Diagnosis not present

## 2022-04-11 DIAGNOSIS — G309 Alzheimer's disease, unspecified: Secondary | ICD-10-CM | POA: Diagnosis not present

## 2022-04-11 DIAGNOSIS — R41841 Cognitive communication deficit: Secondary | ICD-10-CM | POA: Diagnosis not present

## 2022-04-11 DIAGNOSIS — L931 Subacute cutaneous lupus erythematosus: Secondary | ICD-10-CM | POA: Diagnosis not present

## 2022-04-11 DIAGNOSIS — M3500 Sicca syndrome, unspecified: Secondary | ICD-10-CM | POA: Diagnosis not present

## 2022-04-11 DIAGNOSIS — E782 Mixed hyperlipidemia: Secondary | ICD-10-CM | POA: Diagnosis not present

## 2022-04-12 ENCOUNTER — Other Ambulatory Visit: Payer: Self-pay | Admitting: Family Medicine

## 2022-04-12 DIAGNOSIS — M8580 Other specified disorders of bone density and structure, unspecified site: Secondary | ICD-10-CM

## 2022-04-17 DIAGNOSIS — R41841 Cognitive communication deficit: Secondary | ICD-10-CM | POA: Diagnosis not present

## 2022-04-17 DIAGNOSIS — M3500 Sicca syndrome, unspecified: Secondary | ICD-10-CM | POA: Diagnosis not present

## 2022-04-17 DIAGNOSIS — G309 Alzheimer's disease, unspecified: Secondary | ICD-10-CM | POA: Diagnosis not present

## 2022-04-17 DIAGNOSIS — E782 Mixed hyperlipidemia: Secondary | ICD-10-CM | POA: Diagnosis not present

## 2022-04-17 DIAGNOSIS — L931 Subacute cutaneous lupus erythematosus: Secondary | ICD-10-CM | POA: Diagnosis not present

## 2022-04-17 DIAGNOSIS — E039 Hypothyroidism, unspecified: Secondary | ICD-10-CM | POA: Diagnosis not present

## 2022-04-19 DIAGNOSIS — E039 Hypothyroidism, unspecified: Secondary | ICD-10-CM | POA: Diagnosis not present

## 2022-04-19 DIAGNOSIS — M3500 Sicca syndrome, unspecified: Secondary | ICD-10-CM | POA: Diagnosis not present

## 2022-04-19 DIAGNOSIS — L931 Subacute cutaneous lupus erythematosus: Secondary | ICD-10-CM | POA: Diagnosis not present

## 2022-04-19 DIAGNOSIS — G309 Alzheimer's disease, unspecified: Secondary | ICD-10-CM | POA: Diagnosis not present

## 2022-04-19 DIAGNOSIS — E782 Mixed hyperlipidemia: Secondary | ICD-10-CM | POA: Diagnosis not present

## 2022-04-19 DIAGNOSIS — R41841 Cognitive communication deficit: Secondary | ICD-10-CM | POA: Diagnosis not present

## 2022-04-24 DIAGNOSIS — G309 Alzheimer's disease, unspecified: Secondary | ICD-10-CM | POA: Diagnosis not present

## 2022-04-24 DIAGNOSIS — E782 Mixed hyperlipidemia: Secondary | ICD-10-CM | POA: Diagnosis not present

## 2022-04-24 DIAGNOSIS — M3500 Sicca syndrome, unspecified: Secondary | ICD-10-CM | POA: Diagnosis not present

## 2022-04-24 DIAGNOSIS — L931 Subacute cutaneous lupus erythematosus: Secondary | ICD-10-CM | POA: Diagnosis not present

## 2022-04-24 DIAGNOSIS — E039 Hypothyroidism, unspecified: Secondary | ICD-10-CM | POA: Diagnosis not present

## 2022-04-24 DIAGNOSIS — R41841 Cognitive communication deficit: Secondary | ICD-10-CM | POA: Diagnosis not present

## 2022-04-25 DIAGNOSIS — E039 Hypothyroidism, unspecified: Secondary | ICD-10-CM | POA: Diagnosis not present

## 2022-04-25 DIAGNOSIS — R41841 Cognitive communication deficit: Secondary | ICD-10-CM | POA: Diagnosis not present

## 2022-04-25 DIAGNOSIS — L931 Subacute cutaneous lupus erythematosus: Secondary | ICD-10-CM | POA: Diagnosis not present

## 2022-04-25 DIAGNOSIS — E782 Mixed hyperlipidemia: Secondary | ICD-10-CM | POA: Diagnosis not present

## 2022-04-25 DIAGNOSIS — G309 Alzheimer's disease, unspecified: Secondary | ICD-10-CM | POA: Diagnosis not present

## 2022-04-25 DIAGNOSIS — M3500 Sicca syndrome, unspecified: Secondary | ICD-10-CM | POA: Diagnosis not present

## 2022-05-01 DIAGNOSIS — E782 Mixed hyperlipidemia: Secondary | ICD-10-CM | POA: Diagnosis not present

## 2022-05-01 DIAGNOSIS — E039 Hypothyroidism, unspecified: Secondary | ICD-10-CM | POA: Diagnosis not present

## 2022-05-01 DIAGNOSIS — M3500 Sicca syndrome, unspecified: Secondary | ICD-10-CM | POA: Diagnosis not present

## 2022-05-01 DIAGNOSIS — L931 Subacute cutaneous lupus erythematosus: Secondary | ICD-10-CM | POA: Diagnosis not present

## 2022-05-01 DIAGNOSIS — R41841 Cognitive communication deficit: Secondary | ICD-10-CM | POA: Diagnosis not present

## 2022-05-01 DIAGNOSIS — G309 Alzheimer's disease, unspecified: Secondary | ICD-10-CM | POA: Diagnosis not present

## 2022-05-03 DIAGNOSIS — R41841 Cognitive communication deficit: Secondary | ICD-10-CM | POA: Diagnosis not present

## 2022-05-03 DIAGNOSIS — E039 Hypothyroidism, unspecified: Secondary | ICD-10-CM | POA: Diagnosis not present

## 2022-05-03 DIAGNOSIS — G309 Alzheimer's disease, unspecified: Secondary | ICD-10-CM | POA: Diagnosis not present

## 2022-05-03 DIAGNOSIS — E782 Mixed hyperlipidemia: Secondary | ICD-10-CM | POA: Diagnosis not present

## 2022-05-03 DIAGNOSIS — L931 Subacute cutaneous lupus erythematosus: Secondary | ICD-10-CM | POA: Diagnosis not present

## 2022-05-03 DIAGNOSIS — M3500 Sicca syndrome, unspecified: Secondary | ICD-10-CM | POA: Diagnosis not present

## 2022-05-06 DIAGNOSIS — R41841 Cognitive communication deficit: Secondary | ICD-10-CM | POA: Diagnosis not present

## 2022-05-06 DIAGNOSIS — L931 Subacute cutaneous lupus erythematosus: Secondary | ICD-10-CM | POA: Diagnosis not present

## 2022-05-06 DIAGNOSIS — E039 Hypothyroidism, unspecified: Secondary | ICD-10-CM | POA: Diagnosis not present

## 2022-05-06 DIAGNOSIS — M3500 Sicca syndrome, unspecified: Secondary | ICD-10-CM | POA: Diagnosis not present

## 2022-05-06 DIAGNOSIS — E782 Mixed hyperlipidemia: Secondary | ICD-10-CM | POA: Diagnosis not present

## 2022-05-06 DIAGNOSIS — G309 Alzheimer's disease, unspecified: Secondary | ICD-10-CM | POA: Diagnosis not present

## 2022-05-10 DIAGNOSIS — G309 Alzheimer's disease, unspecified: Secondary | ICD-10-CM | POA: Diagnosis not present

## 2022-05-10 DIAGNOSIS — E782 Mixed hyperlipidemia: Secondary | ICD-10-CM | POA: Diagnosis not present

## 2022-05-10 DIAGNOSIS — M3500 Sicca syndrome, unspecified: Secondary | ICD-10-CM | POA: Diagnosis not present

## 2022-05-10 DIAGNOSIS — L931 Subacute cutaneous lupus erythematosus: Secondary | ICD-10-CM | POA: Diagnosis not present

## 2022-05-10 DIAGNOSIS — R41841 Cognitive communication deficit: Secondary | ICD-10-CM | POA: Diagnosis not present

## 2022-05-10 DIAGNOSIS — E039 Hypothyroidism, unspecified: Secondary | ICD-10-CM | POA: Diagnosis not present

## 2022-05-13 DIAGNOSIS — G309 Alzheimer's disease, unspecified: Secondary | ICD-10-CM | POA: Diagnosis not present

## 2022-05-13 DIAGNOSIS — L931 Subacute cutaneous lupus erythematosus: Secondary | ICD-10-CM | POA: Diagnosis not present

## 2022-05-13 DIAGNOSIS — M3500 Sicca syndrome, unspecified: Secondary | ICD-10-CM | POA: Diagnosis not present

## 2022-05-13 DIAGNOSIS — R41841 Cognitive communication deficit: Secondary | ICD-10-CM | POA: Diagnosis not present

## 2022-05-13 DIAGNOSIS — E782 Mixed hyperlipidemia: Secondary | ICD-10-CM | POA: Diagnosis not present

## 2022-05-13 DIAGNOSIS — E039 Hypothyroidism, unspecified: Secondary | ICD-10-CM | POA: Diagnosis not present

## 2022-05-17 DIAGNOSIS — E039 Hypothyroidism, unspecified: Secondary | ICD-10-CM | POA: Diagnosis not present

## 2022-05-17 DIAGNOSIS — M3500 Sicca syndrome, unspecified: Secondary | ICD-10-CM | POA: Diagnosis not present

## 2022-05-17 DIAGNOSIS — G309 Alzheimer's disease, unspecified: Secondary | ICD-10-CM | POA: Diagnosis not present

## 2022-05-17 DIAGNOSIS — L931 Subacute cutaneous lupus erythematosus: Secondary | ICD-10-CM | POA: Diagnosis not present

## 2022-05-17 DIAGNOSIS — R41841 Cognitive communication deficit: Secondary | ICD-10-CM | POA: Diagnosis not present

## 2022-05-17 DIAGNOSIS — E782 Mixed hyperlipidemia: Secondary | ICD-10-CM | POA: Diagnosis not present

## 2022-05-21 DIAGNOSIS — M3500 Sicca syndrome, unspecified: Secondary | ICD-10-CM | POA: Diagnosis not present

## 2022-05-21 DIAGNOSIS — E039 Hypothyroidism, unspecified: Secondary | ICD-10-CM | POA: Diagnosis not present

## 2022-05-21 DIAGNOSIS — R41841 Cognitive communication deficit: Secondary | ICD-10-CM | POA: Diagnosis not present

## 2022-05-21 DIAGNOSIS — E782 Mixed hyperlipidemia: Secondary | ICD-10-CM | POA: Diagnosis not present

## 2022-05-21 DIAGNOSIS — G309 Alzheimer's disease, unspecified: Secondary | ICD-10-CM | POA: Diagnosis not present

## 2022-05-21 DIAGNOSIS — L931 Subacute cutaneous lupus erythematosus: Secondary | ICD-10-CM | POA: Diagnosis not present

## 2022-05-24 DIAGNOSIS — R41841 Cognitive communication deficit: Secondary | ICD-10-CM | POA: Diagnosis not present

## 2022-05-24 DIAGNOSIS — G309 Alzheimer's disease, unspecified: Secondary | ICD-10-CM | POA: Diagnosis not present

## 2022-05-24 DIAGNOSIS — L931 Subacute cutaneous lupus erythematosus: Secondary | ICD-10-CM | POA: Diagnosis not present

## 2022-05-24 DIAGNOSIS — M3500 Sicca syndrome, unspecified: Secondary | ICD-10-CM | POA: Diagnosis not present

## 2022-05-24 DIAGNOSIS — E039 Hypothyroidism, unspecified: Secondary | ICD-10-CM | POA: Diagnosis not present

## 2022-05-24 DIAGNOSIS — E782 Mixed hyperlipidemia: Secondary | ICD-10-CM | POA: Diagnosis not present

## 2022-05-30 DIAGNOSIS — L931 Subacute cutaneous lupus erythematosus: Secondary | ICD-10-CM | POA: Diagnosis not present

## 2022-05-30 DIAGNOSIS — G309 Alzheimer's disease, unspecified: Secondary | ICD-10-CM | POA: Diagnosis not present

## 2022-05-30 DIAGNOSIS — R41841 Cognitive communication deficit: Secondary | ICD-10-CM | POA: Diagnosis not present

## 2022-05-30 DIAGNOSIS — E782 Mixed hyperlipidemia: Secondary | ICD-10-CM | POA: Diagnosis not present

## 2022-05-30 DIAGNOSIS — M3500 Sicca syndrome, unspecified: Secondary | ICD-10-CM | POA: Diagnosis not present

## 2022-05-30 DIAGNOSIS — E039 Hypothyroidism, unspecified: Secondary | ICD-10-CM | POA: Diagnosis not present

## 2022-06-03 IMAGING — MG MM DIGITAL SCREENING BILAT W/ TOMO AND CAD
8 series · 9 of 24 positions shown · non-contrast
Comparison: Previous exam(s).

CLINICAL DATA: Screening.

EXAM:
DIGITAL SCREENING BILATERAL MAMMOGRAM WITH TOMOSYNTHESIS AND CAD
TECHNIQUE: Bilateral screening digital craniocaudal and mediolateral oblique
mammograms were obtained. Bilateral screening digital breast
tomosynthesis was performed. The images were evaluated with
computer-aided detection.

[R CC synth-2D]
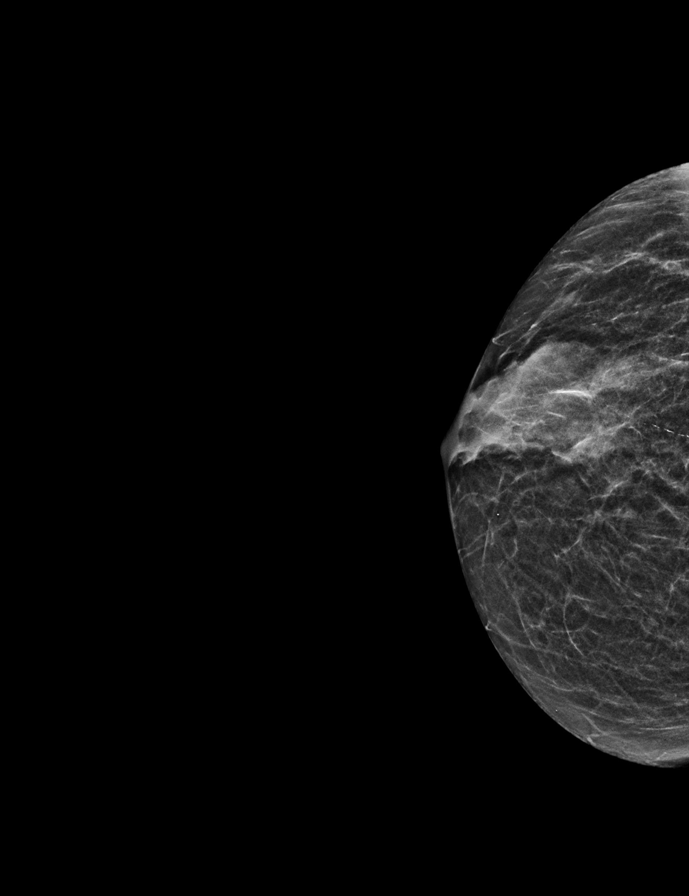

[L CC synth-2D]
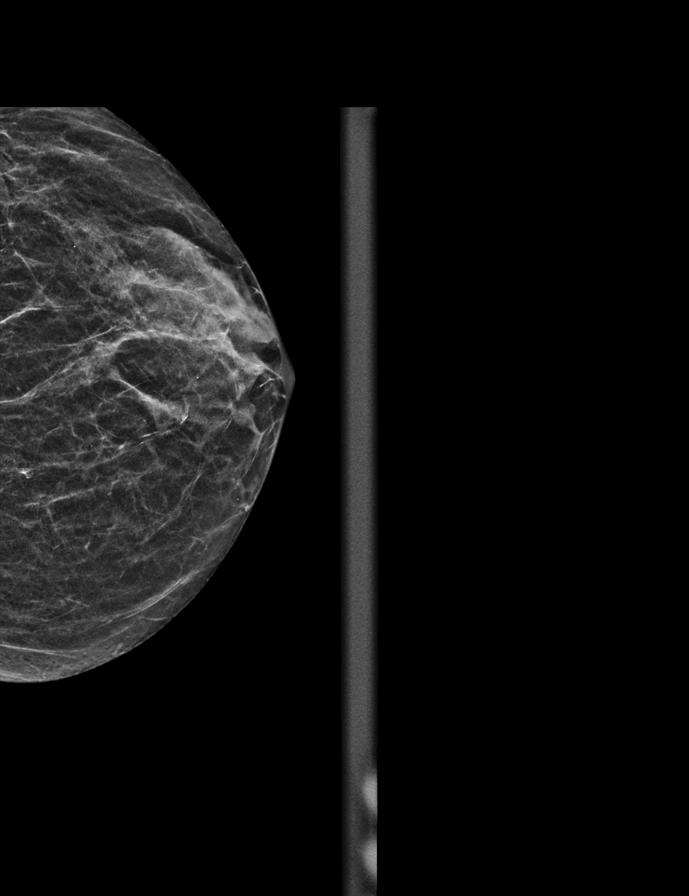

[L MLO synth-2D]
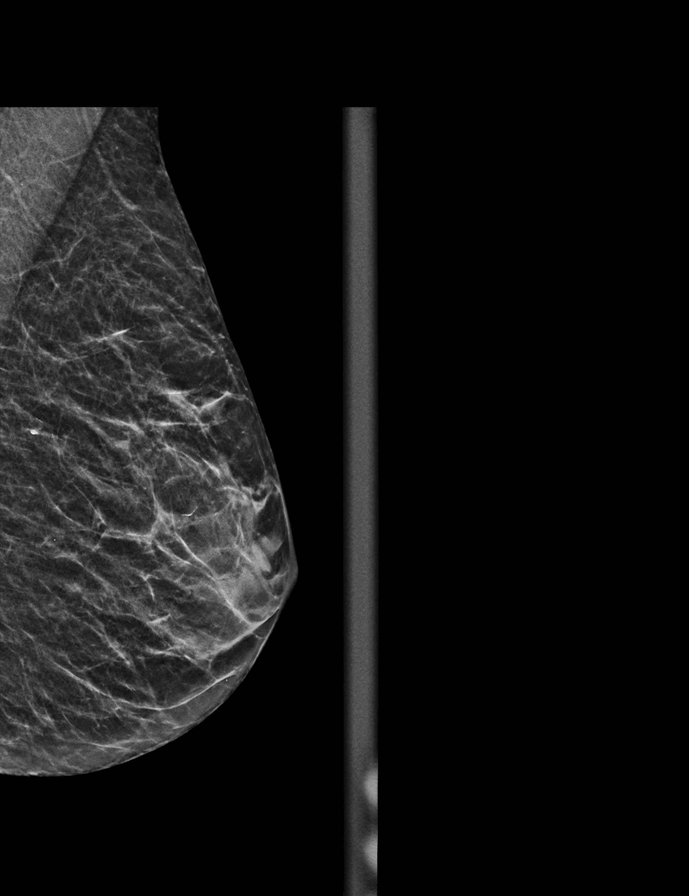

[R MLO synth-2D]
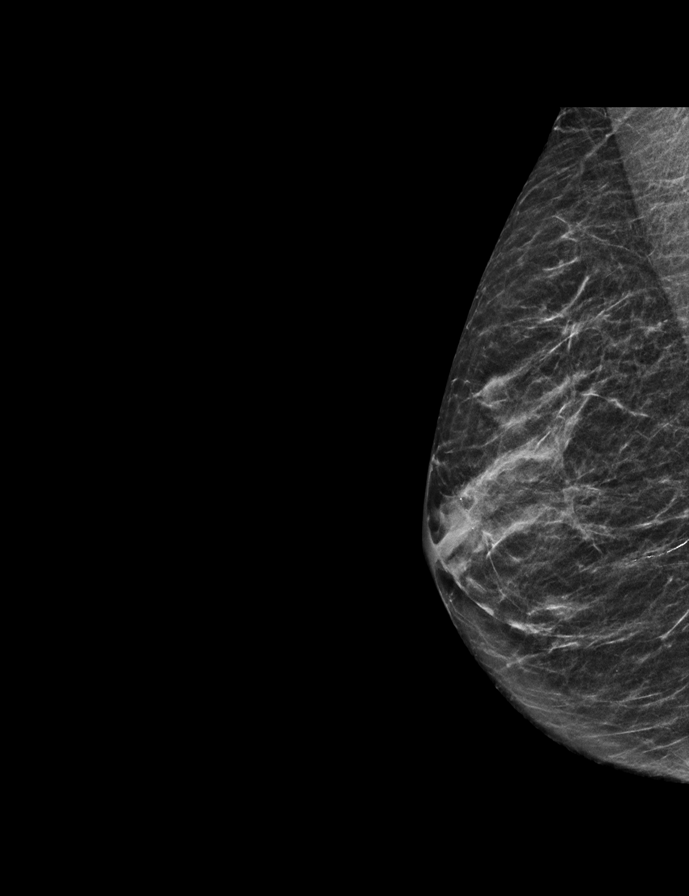

[L MLO tomo · 2 of 33 frames shown]
[frame 11/33]
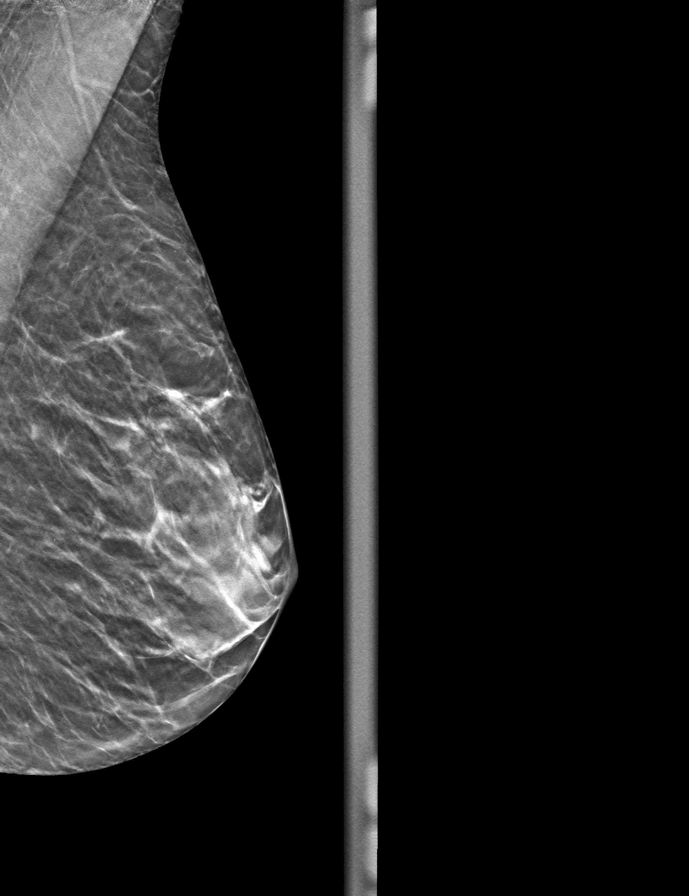
[frame 17/33]
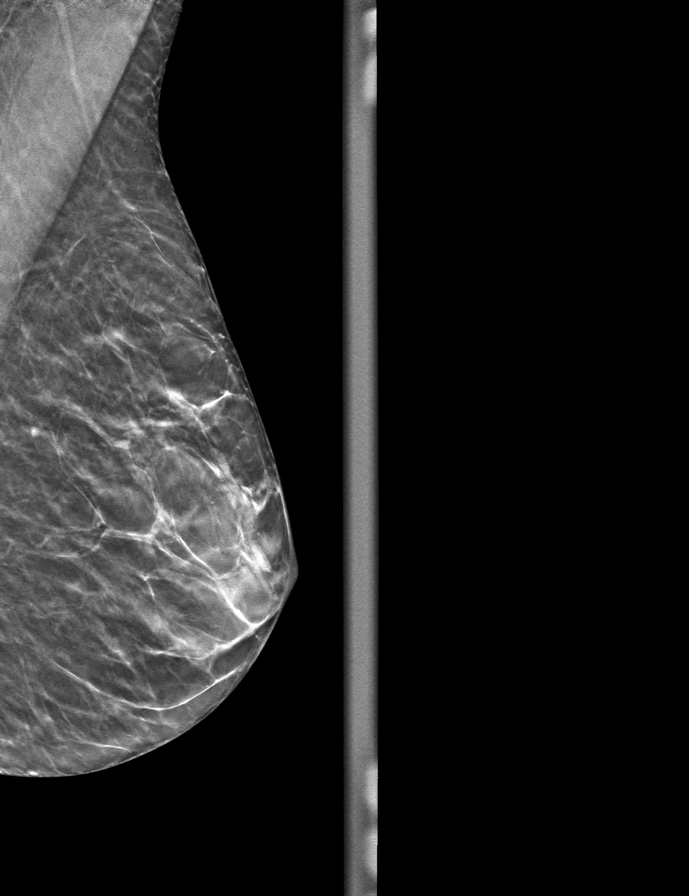

[R CC tomo · tomo slice 17/34.0]
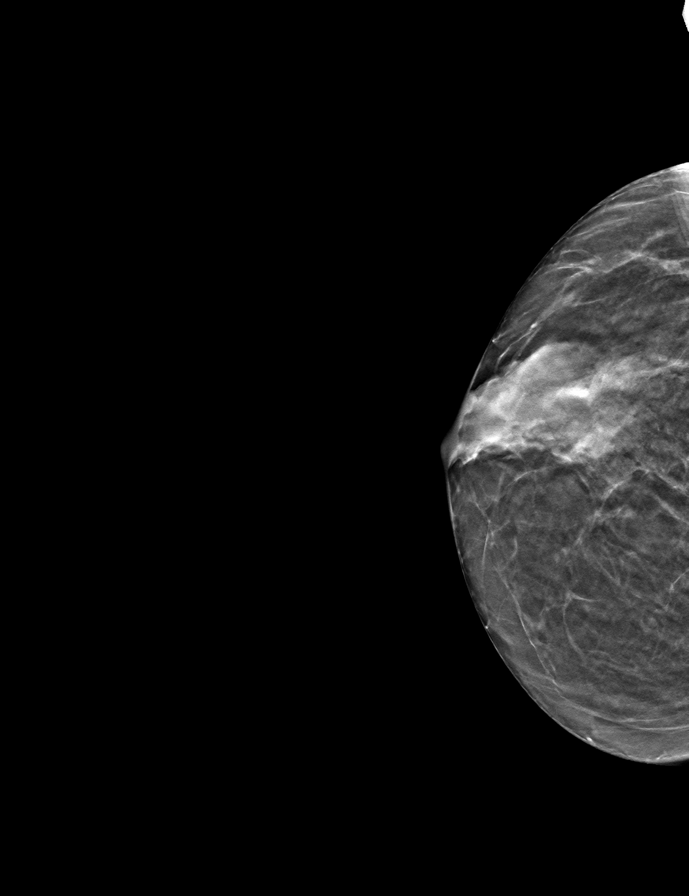

[R MLO tomo · tomo slice 19/38.0]
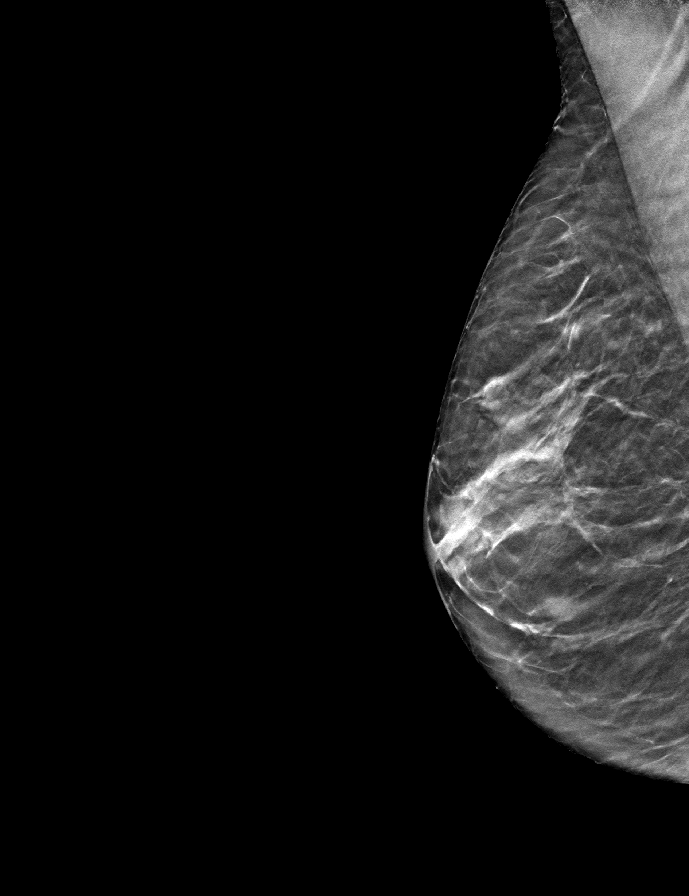

[L CC tomo · tomo slice 16/31.0]
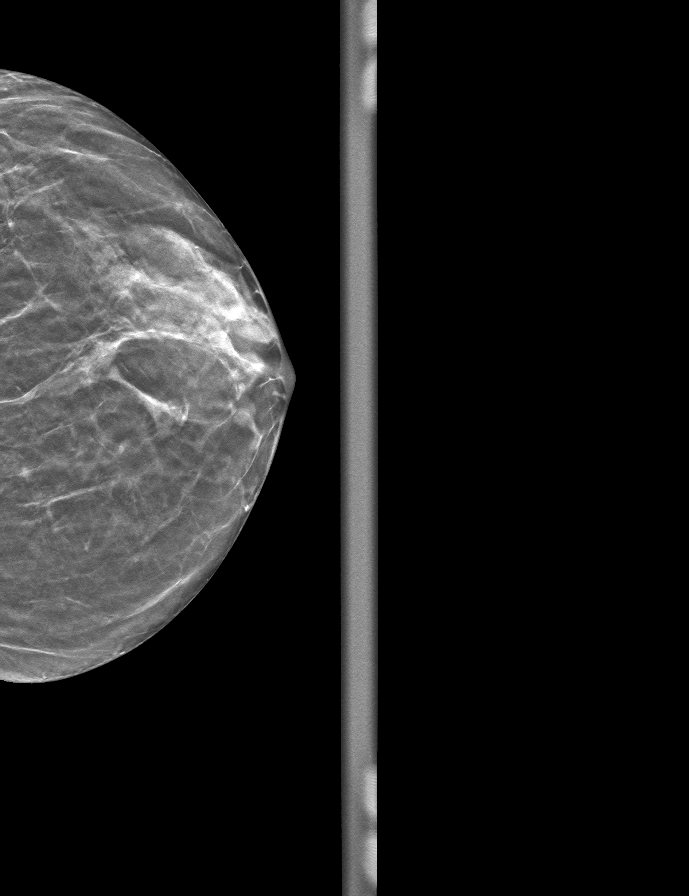

[9 of 24 positions shown; findings below may reference images not displayed]

ACR Breast Density Category c: The breast tissue is heterogeneously
dense, which may obscure small masses.
FINDINGS: There are no findings suspicious for malignancy. The images were
evaluated with computer-aided detection.
IMPRESSION: No mammographic evidence of malignancy. A result letter of this
screening mammogram will be mailed directly to the patient.

RECOMMENDATION:
Screening mammogram in one year. (Code:T4-5-GWO)

BI-RADS CATEGORY  1: Negative.

## 2022-06-11 DIAGNOSIS — G309 Alzheimer's disease, unspecified: Secondary | ICD-10-CM | POA: Diagnosis not present

## 2022-06-11 DIAGNOSIS — R41841 Cognitive communication deficit: Secondary | ICD-10-CM | POA: Diagnosis not present

## 2022-06-11 DIAGNOSIS — L931 Subacute cutaneous lupus erythematosus: Secondary | ICD-10-CM | POA: Diagnosis not present

## 2022-06-11 DIAGNOSIS — E039 Hypothyroidism, unspecified: Secondary | ICD-10-CM | POA: Diagnosis not present

## 2022-06-11 DIAGNOSIS — E782 Mixed hyperlipidemia: Secondary | ICD-10-CM | POA: Diagnosis not present

## 2022-06-11 DIAGNOSIS — M3500 Sicca syndrome, unspecified: Secondary | ICD-10-CM | POA: Diagnosis not present

## 2022-06-13 DIAGNOSIS — M3500 Sicca syndrome, unspecified: Secondary | ICD-10-CM | POA: Diagnosis not present

## 2022-06-13 DIAGNOSIS — L931 Subacute cutaneous lupus erythematosus: Secondary | ICD-10-CM | POA: Diagnosis not present

## 2022-06-13 DIAGNOSIS — E782 Mixed hyperlipidemia: Secondary | ICD-10-CM | POA: Diagnosis not present

## 2022-06-13 DIAGNOSIS — R41841 Cognitive communication deficit: Secondary | ICD-10-CM | POA: Diagnosis not present

## 2022-06-13 DIAGNOSIS — E039 Hypothyroidism, unspecified: Secondary | ICD-10-CM | POA: Diagnosis not present

## 2022-06-13 DIAGNOSIS — G309 Alzheimer's disease, unspecified: Secondary | ICD-10-CM | POA: Diagnosis not present

## 2022-06-18 DIAGNOSIS — E039 Hypothyroidism, unspecified: Secondary | ICD-10-CM | POA: Diagnosis not present

## 2022-06-18 DIAGNOSIS — G309 Alzheimer's disease, unspecified: Secondary | ICD-10-CM | POA: Diagnosis not present

## 2022-06-18 DIAGNOSIS — M3500 Sicca syndrome, unspecified: Secondary | ICD-10-CM | POA: Diagnosis not present

## 2022-06-18 DIAGNOSIS — R41841 Cognitive communication deficit: Secondary | ICD-10-CM | POA: Diagnosis not present

## 2022-06-18 DIAGNOSIS — L931 Subacute cutaneous lupus erythematosus: Secondary | ICD-10-CM | POA: Diagnosis not present

## 2022-06-18 DIAGNOSIS — E782 Mixed hyperlipidemia: Secondary | ICD-10-CM | POA: Diagnosis not present

## 2022-06-20 DIAGNOSIS — R41841 Cognitive communication deficit: Secondary | ICD-10-CM | POA: Diagnosis not present

## 2022-06-20 DIAGNOSIS — E782 Mixed hyperlipidemia: Secondary | ICD-10-CM | POA: Diagnosis not present

## 2022-06-20 DIAGNOSIS — E039 Hypothyroidism, unspecified: Secondary | ICD-10-CM | POA: Diagnosis not present

## 2022-06-20 DIAGNOSIS — L931 Subacute cutaneous lupus erythematosus: Secondary | ICD-10-CM | POA: Diagnosis not present

## 2022-06-20 DIAGNOSIS — G309 Alzheimer's disease, unspecified: Secondary | ICD-10-CM | POA: Diagnosis not present

## 2022-06-20 DIAGNOSIS — M3500 Sicca syndrome, unspecified: Secondary | ICD-10-CM | POA: Diagnosis not present

## 2022-06-21 DIAGNOSIS — M3500 Sicca syndrome, unspecified: Secondary | ICD-10-CM | POA: Diagnosis not present

## 2022-06-21 DIAGNOSIS — E782 Mixed hyperlipidemia: Secondary | ICD-10-CM | POA: Diagnosis not present

## 2022-06-21 DIAGNOSIS — G309 Alzheimer's disease, unspecified: Secondary | ICD-10-CM | POA: Diagnosis not present

## 2022-06-21 DIAGNOSIS — L931 Subacute cutaneous lupus erythematosus: Secondary | ICD-10-CM | POA: Diagnosis not present

## 2022-06-21 DIAGNOSIS — R41841 Cognitive communication deficit: Secondary | ICD-10-CM | POA: Diagnosis not present

## 2022-06-21 DIAGNOSIS — E039 Hypothyroidism, unspecified: Secondary | ICD-10-CM | POA: Diagnosis not present

## 2022-06-25 DIAGNOSIS — L931 Subacute cutaneous lupus erythematosus: Secondary | ICD-10-CM | POA: Diagnosis not present

## 2022-06-25 DIAGNOSIS — E039 Hypothyroidism, unspecified: Secondary | ICD-10-CM | POA: Diagnosis not present

## 2022-06-25 DIAGNOSIS — M3500 Sicca syndrome, unspecified: Secondary | ICD-10-CM | POA: Diagnosis not present

## 2022-06-25 DIAGNOSIS — E782 Mixed hyperlipidemia: Secondary | ICD-10-CM | POA: Diagnosis not present

## 2022-06-25 DIAGNOSIS — G309 Alzheimer's disease, unspecified: Secondary | ICD-10-CM | POA: Diagnosis not present

## 2022-06-25 DIAGNOSIS — R41841 Cognitive communication deficit: Secondary | ICD-10-CM | POA: Diagnosis not present

## 2022-06-27 DIAGNOSIS — G309 Alzheimer's disease, unspecified: Secondary | ICD-10-CM | POA: Diagnosis not present

## 2022-06-27 DIAGNOSIS — M3500 Sicca syndrome, unspecified: Secondary | ICD-10-CM | POA: Diagnosis not present

## 2022-06-27 DIAGNOSIS — L931 Subacute cutaneous lupus erythematosus: Secondary | ICD-10-CM | POA: Diagnosis not present

## 2022-06-27 DIAGNOSIS — R41841 Cognitive communication deficit: Secondary | ICD-10-CM | POA: Diagnosis not present

## 2022-06-27 DIAGNOSIS — E782 Mixed hyperlipidemia: Secondary | ICD-10-CM | POA: Diagnosis not present

## 2022-06-27 DIAGNOSIS — E039 Hypothyroidism, unspecified: Secondary | ICD-10-CM | POA: Diagnosis not present

## 2022-06-28 DIAGNOSIS — E782 Mixed hyperlipidemia: Secondary | ICD-10-CM | POA: Diagnosis not present

## 2022-06-28 DIAGNOSIS — R41841 Cognitive communication deficit: Secondary | ICD-10-CM | POA: Diagnosis not present

## 2022-06-28 DIAGNOSIS — M3500 Sicca syndrome, unspecified: Secondary | ICD-10-CM | POA: Diagnosis not present

## 2022-06-28 DIAGNOSIS — E039 Hypothyroidism, unspecified: Secondary | ICD-10-CM | POA: Diagnosis not present

## 2022-06-28 DIAGNOSIS — G309 Alzheimer's disease, unspecified: Secondary | ICD-10-CM | POA: Diagnosis not present

## 2022-06-28 DIAGNOSIS — L931 Subacute cutaneous lupus erythematosus: Secondary | ICD-10-CM | POA: Diagnosis not present

## 2022-07-01 DIAGNOSIS — R41841 Cognitive communication deficit: Secondary | ICD-10-CM | POA: Diagnosis not present

## 2022-07-01 DIAGNOSIS — M3500 Sicca syndrome, unspecified: Secondary | ICD-10-CM | POA: Diagnosis not present

## 2022-07-01 DIAGNOSIS — E782 Mixed hyperlipidemia: Secondary | ICD-10-CM | POA: Diagnosis not present

## 2022-07-01 DIAGNOSIS — L931 Subacute cutaneous lupus erythematosus: Secondary | ICD-10-CM | POA: Diagnosis not present

## 2022-07-01 DIAGNOSIS — E039 Hypothyroidism, unspecified: Secondary | ICD-10-CM | POA: Diagnosis not present

## 2022-07-01 DIAGNOSIS — G309 Alzheimer's disease, unspecified: Secondary | ICD-10-CM | POA: Diagnosis not present

## 2022-07-02 DIAGNOSIS — E782 Mixed hyperlipidemia: Secondary | ICD-10-CM | POA: Diagnosis not present

## 2022-07-02 DIAGNOSIS — G309 Alzheimer's disease, unspecified: Secondary | ICD-10-CM | POA: Diagnosis not present

## 2022-07-02 DIAGNOSIS — E039 Hypothyroidism, unspecified: Secondary | ICD-10-CM | POA: Diagnosis not present

## 2022-07-02 DIAGNOSIS — R41841 Cognitive communication deficit: Secondary | ICD-10-CM | POA: Diagnosis not present

## 2022-07-02 DIAGNOSIS — L931 Subacute cutaneous lupus erythematosus: Secondary | ICD-10-CM | POA: Diagnosis not present

## 2022-07-02 DIAGNOSIS — M3500 Sicca syndrome, unspecified: Secondary | ICD-10-CM | POA: Diagnosis not present

## 2022-07-16 DIAGNOSIS — E782 Mixed hyperlipidemia: Secondary | ICD-10-CM | POA: Diagnosis not present

## 2022-07-16 DIAGNOSIS — M3500 Sicca syndrome, unspecified: Secondary | ICD-10-CM | POA: Diagnosis not present

## 2022-07-16 DIAGNOSIS — L931 Subacute cutaneous lupus erythematosus: Secondary | ICD-10-CM | POA: Diagnosis not present

## 2022-07-16 DIAGNOSIS — G309 Alzheimer's disease, unspecified: Secondary | ICD-10-CM | POA: Diagnosis not present

## 2022-07-16 DIAGNOSIS — R41841 Cognitive communication deficit: Secondary | ICD-10-CM | POA: Diagnosis not present

## 2022-07-16 DIAGNOSIS — E039 Hypothyroidism, unspecified: Secondary | ICD-10-CM | POA: Diagnosis not present

## 2022-07-19 DIAGNOSIS — E782 Mixed hyperlipidemia: Secondary | ICD-10-CM | POA: Diagnosis not present

## 2022-07-19 DIAGNOSIS — G309 Alzheimer's disease, unspecified: Secondary | ICD-10-CM | POA: Diagnosis not present

## 2022-07-19 DIAGNOSIS — L931 Subacute cutaneous lupus erythematosus: Secondary | ICD-10-CM | POA: Diagnosis not present

## 2022-07-19 DIAGNOSIS — M3500 Sicca syndrome, unspecified: Secondary | ICD-10-CM | POA: Diagnosis not present

## 2022-07-19 DIAGNOSIS — E039 Hypothyroidism, unspecified: Secondary | ICD-10-CM | POA: Diagnosis not present

## 2022-07-19 DIAGNOSIS — R41841 Cognitive communication deficit: Secondary | ICD-10-CM | POA: Diagnosis not present

## 2022-07-25 DIAGNOSIS — R41841 Cognitive communication deficit: Secondary | ICD-10-CM | POA: Diagnosis not present

## 2022-07-25 DIAGNOSIS — E782 Mixed hyperlipidemia: Secondary | ICD-10-CM | POA: Diagnosis not present

## 2022-07-25 DIAGNOSIS — E039 Hypothyroidism, unspecified: Secondary | ICD-10-CM | POA: Diagnosis not present

## 2022-07-25 DIAGNOSIS — G309 Alzheimer's disease, unspecified: Secondary | ICD-10-CM | POA: Diagnosis not present

## 2022-07-25 DIAGNOSIS — M3500 Sicca syndrome, unspecified: Secondary | ICD-10-CM | POA: Diagnosis not present

## 2022-07-25 DIAGNOSIS — L931 Subacute cutaneous lupus erythematosus: Secondary | ICD-10-CM | POA: Diagnosis not present

## 2022-07-31 DIAGNOSIS — G309 Alzheimer's disease, unspecified: Secondary | ICD-10-CM | POA: Diagnosis not present

## 2022-07-31 DIAGNOSIS — E782 Mixed hyperlipidemia: Secondary | ICD-10-CM | POA: Diagnosis not present

## 2022-07-31 DIAGNOSIS — L931 Subacute cutaneous lupus erythematosus: Secondary | ICD-10-CM | POA: Diagnosis not present

## 2022-07-31 DIAGNOSIS — M3500 Sicca syndrome, unspecified: Secondary | ICD-10-CM | POA: Diagnosis not present

## 2022-07-31 DIAGNOSIS — R41841 Cognitive communication deficit: Secondary | ICD-10-CM | POA: Diagnosis not present

## 2022-07-31 DIAGNOSIS — E039 Hypothyroidism, unspecified: Secondary | ICD-10-CM | POA: Diagnosis not present

## 2022-08-06 DIAGNOSIS — M3500 Sicca syndrome, unspecified: Secondary | ICD-10-CM | POA: Diagnosis not present

## 2022-08-06 DIAGNOSIS — E782 Mixed hyperlipidemia: Secondary | ICD-10-CM | POA: Diagnosis not present

## 2022-08-06 DIAGNOSIS — L931 Subacute cutaneous lupus erythematosus: Secondary | ICD-10-CM | POA: Diagnosis not present

## 2022-08-06 DIAGNOSIS — E039 Hypothyroidism, unspecified: Secondary | ICD-10-CM | POA: Diagnosis not present

## 2022-08-06 DIAGNOSIS — R41841 Cognitive communication deficit: Secondary | ICD-10-CM | POA: Diagnosis not present

## 2022-08-06 DIAGNOSIS — G309 Alzheimer's disease, unspecified: Secondary | ICD-10-CM | POA: Diagnosis not present

## 2022-08-09 DIAGNOSIS — E782 Mixed hyperlipidemia: Secondary | ICD-10-CM | POA: Diagnosis not present

## 2022-08-09 DIAGNOSIS — R41841 Cognitive communication deficit: Secondary | ICD-10-CM | POA: Diagnosis not present

## 2022-08-09 DIAGNOSIS — G309 Alzheimer's disease, unspecified: Secondary | ICD-10-CM | POA: Diagnosis not present

## 2022-08-09 DIAGNOSIS — M3500 Sicca syndrome, unspecified: Secondary | ICD-10-CM | POA: Diagnosis not present

## 2022-08-09 DIAGNOSIS — E039 Hypothyroidism, unspecified: Secondary | ICD-10-CM | POA: Diagnosis not present

## 2022-08-09 DIAGNOSIS — L931 Subacute cutaneous lupus erythematosus: Secondary | ICD-10-CM | POA: Diagnosis not present

## 2022-08-14 DIAGNOSIS — E039 Hypothyroidism, unspecified: Secondary | ICD-10-CM | POA: Diagnosis not present

## 2022-08-14 DIAGNOSIS — E782 Mixed hyperlipidemia: Secondary | ICD-10-CM | POA: Diagnosis not present

## 2022-08-14 DIAGNOSIS — M3500 Sicca syndrome, unspecified: Secondary | ICD-10-CM | POA: Diagnosis not present

## 2022-08-14 DIAGNOSIS — L931 Subacute cutaneous lupus erythematosus: Secondary | ICD-10-CM | POA: Diagnosis not present

## 2022-08-14 DIAGNOSIS — G309 Alzheimer's disease, unspecified: Secondary | ICD-10-CM | POA: Diagnosis not present

## 2022-08-14 DIAGNOSIS — R41841 Cognitive communication deficit: Secondary | ICD-10-CM | POA: Diagnosis not present

## 2022-08-15 DIAGNOSIS — M3500 Sicca syndrome, unspecified: Secondary | ICD-10-CM | POA: Diagnosis not present

## 2022-08-15 DIAGNOSIS — R41841 Cognitive communication deficit: Secondary | ICD-10-CM | POA: Diagnosis not present

## 2022-08-15 DIAGNOSIS — G309 Alzheimer's disease, unspecified: Secondary | ICD-10-CM | POA: Diagnosis not present

## 2022-08-15 DIAGNOSIS — E782 Mixed hyperlipidemia: Secondary | ICD-10-CM | POA: Diagnosis not present

## 2022-08-15 DIAGNOSIS — L931 Subacute cutaneous lupus erythematosus: Secondary | ICD-10-CM | POA: Diagnosis not present

## 2022-08-15 DIAGNOSIS — E039 Hypothyroidism, unspecified: Secondary | ICD-10-CM | POA: Diagnosis not present

## 2022-08-22 DIAGNOSIS — M3500 Sicca syndrome, unspecified: Secondary | ICD-10-CM | POA: Diagnosis not present

## 2022-08-22 DIAGNOSIS — E039 Hypothyroidism, unspecified: Secondary | ICD-10-CM | POA: Diagnosis not present

## 2022-08-22 DIAGNOSIS — E782 Mixed hyperlipidemia: Secondary | ICD-10-CM | POA: Diagnosis not present

## 2022-08-22 DIAGNOSIS — L931 Subacute cutaneous lupus erythematosus: Secondary | ICD-10-CM | POA: Diagnosis not present

## 2022-08-22 DIAGNOSIS — R41841 Cognitive communication deficit: Secondary | ICD-10-CM | POA: Diagnosis not present

## 2022-08-22 DIAGNOSIS — G309 Alzheimer's disease, unspecified: Secondary | ICD-10-CM | POA: Diagnosis not present

## 2022-08-27 DIAGNOSIS — E782 Mixed hyperlipidemia: Secondary | ICD-10-CM | POA: Diagnosis not present

## 2022-08-27 DIAGNOSIS — R41841 Cognitive communication deficit: Secondary | ICD-10-CM | POA: Diagnosis not present

## 2022-08-27 DIAGNOSIS — E039 Hypothyroidism, unspecified: Secondary | ICD-10-CM | POA: Diagnosis not present

## 2022-08-27 DIAGNOSIS — L931 Subacute cutaneous lupus erythematosus: Secondary | ICD-10-CM | POA: Diagnosis not present

## 2022-08-27 DIAGNOSIS — G309 Alzheimer's disease, unspecified: Secondary | ICD-10-CM | POA: Diagnosis not present

## 2022-08-27 DIAGNOSIS — M3500 Sicca syndrome, unspecified: Secondary | ICD-10-CM | POA: Diagnosis not present

## 2022-08-30 DIAGNOSIS — R41841 Cognitive communication deficit: Secondary | ICD-10-CM | POA: Diagnosis not present

## 2022-08-30 DIAGNOSIS — E782 Mixed hyperlipidemia: Secondary | ICD-10-CM | POA: Diagnosis not present

## 2022-08-30 DIAGNOSIS — L931 Subacute cutaneous lupus erythematosus: Secondary | ICD-10-CM | POA: Diagnosis not present

## 2022-08-30 DIAGNOSIS — G309 Alzheimer's disease, unspecified: Secondary | ICD-10-CM | POA: Diagnosis not present

## 2022-08-30 DIAGNOSIS — E039 Hypothyroidism, unspecified: Secondary | ICD-10-CM | POA: Diagnosis not present

## 2022-08-30 DIAGNOSIS — M3500 Sicca syndrome, unspecified: Secondary | ICD-10-CM | POA: Diagnosis not present

## 2022-09-11 DIAGNOSIS — E039 Hypothyroidism, unspecified: Secondary | ICD-10-CM | POA: Diagnosis not present

## 2022-09-11 DIAGNOSIS — R41841 Cognitive communication deficit: Secondary | ICD-10-CM | POA: Diagnosis not present

## 2022-09-11 DIAGNOSIS — G309 Alzheimer's disease, unspecified: Secondary | ICD-10-CM | POA: Diagnosis not present

## 2022-09-11 DIAGNOSIS — E782 Mixed hyperlipidemia: Secondary | ICD-10-CM | POA: Diagnosis not present

## 2022-09-11 DIAGNOSIS — L931 Subacute cutaneous lupus erythematosus: Secondary | ICD-10-CM | POA: Diagnosis not present

## 2022-09-11 DIAGNOSIS — M3500 Sicca syndrome, unspecified: Secondary | ICD-10-CM | POA: Diagnosis not present

## 2022-09-13 DIAGNOSIS — M3500 Sicca syndrome, unspecified: Secondary | ICD-10-CM | POA: Diagnosis not present

## 2022-09-13 DIAGNOSIS — G309 Alzheimer's disease, unspecified: Secondary | ICD-10-CM | POA: Diagnosis not present

## 2022-09-13 DIAGNOSIS — E782 Mixed hyperlipidemia: Secondary | ICD-10-CM | POA: Diagnosis not present

## 2022-09-13 DIAGNOSIS — R41841 Cognitive communication deficit: Secondary | ICD-10-CM | POA: Diagnosis not present

## 2022-09-13 DIAGNOSIS — E039 Hypothyroidism, unspecified: Secondary | ICD-10-CM | POA: Diagnosis not present

## 2022-09-13 DIAGNOSIS — L931 Subacute cutaneous lupus erythematosus: Secondary | ICD-10-CM | POA: Diagnosis not present

## 2022-09-17 DIAGNOSIS — E039 Hypothyroidism, unspecified: Secondary | ICD-10-CM | POA: Diagnosis not present

## 2022-09-17 DIAGNOSIS — G309 Alzheimer's disease, unspecified: Secondary | ICD-10-CM | POA: Diagnosis not present

## 2022-09-17 DIAGNOSIS — M3500 Sicca syndrome, unspecified: Secondary | ICD-10-CM | POA: Diagnosis not present

## 2022-09-17 DIAGNOSIS — R41841 Cognitive communication deficit: Secondary | ICD-10-CM | POA: Diagnosis not present

## 2022-09-17 DIAGNOSIS — E782 Mixed hyperlipidemia: Secondary | ICD-10-CM | POA: Diagnosis not present

## 2022-09-17 DIAGNOSIS — L931 Subacute cutaneous lupus erythematosus: Secondary | ICD-10-CM | POA: Diagnosis not present

## 2022-09-20 DIAGNOSIS — G309 Alzheimer's disease, unspecified: Secondary | ICD-10-CM | POA: Diagnosis not present

## 2022-09-20 DIAGNOSIS — L931 Subacute cutaneous lupus erythematosus: Secondary | ICD-10-CM | POA: Diagnosis not present

## 2022-09-20 DIAGNOSIS — M3500 Sicca syndrome, unspecified: Secondary | ICD-10-CM | POA: Diagnosis not present

## 2022-09-20 DIAGNOSIS — E039 Hypothyroidism, unspecified: Secondary | ICD-10-CM | POA: Diagnosis not present

## 2022-09-20 DIAGNOSIS — R41841 Cognitive communication deficit: Secondary | ICD-10-CM | POA: Diagnosis not present

## 2022-09-20 DIAGNOSIS — E782 Mixed hyperlipidemia: Secondary | ICD-10-CM | POA: Diagnosis not present

## 2022-09-24 DIAGNOSIS — G309 Alzheimer's disease, unspecified: Secondary | ICD-10-CM | POA: Diagnosis not present

## 2022-09-24 DIAGNOSIS — E782 Mixed hyperlipidemia: Secondary | ICD-10-CM | POA: Diagnosis not present

## 2022-09-24 DIAGNOSIS — M3500 Sicca syndrome, unspecified: Secondary | ICD-10-CM | POA: Diagnosis not present

## 2022-09-24 DIAGNOSIS — E039 Hypothyroidism, unspecified: Secondary | ICD-10-CM | POA: Diagnosis not present

## 2022-09-24 DIAGNOSIS — R41841 Cognitive communication deficit: Secondary | ICD-10-CM | POA: Diagnosis not present

## 2022-09-24 DIAGNOSIS — L931 Subacute cutaneous lupus erythematosus: Secondary | ICD-10-CM | POA: Diagnosis not present

## 2022-09-27 DIAGNOSIS — G309 Alzheimer's disease, unspecified: Secondary | ICD-10-CM | POA: Diagnosis not present

## 2022-09-27 DIAGNOSIS — L931 Subacute cutaneous lupus erythematosus: Secondary | ICD-10-CM | POA: Diagnosis not present

## 2022-09-27 DIAGNOSIS — M3500 Sicca syndrome, unspecified: Secondary | ICD-10-CM | POA: Diagnosis not present

## 2022-09-27 DIAGNOSIS — E782 Mixed hyperlipidemia: Secondary | ICD-10-CM | POA: Diagnosis not present

## 2022-09-27 DIAGNOSIS — E039 Hypothyroidism, unspecified: Secondary | ICD-10-CM | POA: Diagnosis not present

## 2022-09-27 DIAGNOSIS — R41841 Cognitive communication deficit: Secondary | ICD-10-CM | POA: Diagnosis not present

## 2022-10-11 DIAGNOSIS — Z9181 History of falling: Secondary | ICD-10-CM | POA: Diagnosis not present

## 2022-10-11 DIAGNOSIS — R278 Other lack of coordination: Secondary | ICD-10-CM | POA: Diagnosis not present

## 2022-10-11 DIAGNOSIS — R2681 Unsteadiness on feet: Secondary | ICD-10-CM | POA: Diagnosis not present

## 2022-10-11 DIAGNOSIS — E782 Mixed hyperlipidemia: Secondary | ICD-10-CM | POA: Diagnosis not present

## 2022-10-11 DIAGNOSIS — E039 Hypothyroidism, unspecified: Secondary | ICD-10-CM | POA: Diagnosis not present

## 2022-10-11 DIAGNOSIS — R41841 Cognitive communication deficit: Secondary | ICD-10-CM | POA: Diagnosis not present

## 2022-10-11 DIAGNOSIS — L931 Subacute cutaneous lupus erythematosus: Secondary | ICD-10-CM | POA: Diagnosis not present

## 2022-10-11 DIAGNOSIS — M6281 Muscle weakness (generalized): Secondary | ICD-10-CM | POA: Diagnosis not present

## 2022-10-11 DIAGNOSIS — M3500 Sicca syndrome, unspecified: Secondary | ICD-10-CM | POA: Diagnosis not present

## 2022-10-11 DIAGNOSIS — G309 Alzheimer's disease, unspecified: Secondary | ICD-10-CM | POA: Diagnosis not present

## 2022-10-12 DIAGNOSIS — R2681 Unsteadiness on feet: Secondary | ICD-10-CM | POA: Diagnosis not present

## 2022-10-12 DIAGNOSIS — M3500 Sicca syndrome, unspecified: Secondary | ICD-10-CM | POA: Diagnosis not present

## 2022-10-12 DIAGNOSIS — R41841 Cognitive communication deficit: Secondary | ICD-10-CM | POA: Diagnosis not present

## 2022-10-12 DIAGNOSIS — M6281 Muscle weakness (generalized): Secondary | ICD-10-CM | POA: Diagnosis not present

## 2022-10-12 DIAGNOSIS — Z9181 History of falling: Secondary | ICD-10-CM | POA: Diagnosis not present

## 2022-10-12 DIAGNOSIS — E039 Hypothyroidism, unspecified: Secondary | ICD-10-CM | POA: Diagnosis not present

## 2022-10-15 DIAGNOSIS — M6281 Muscle weakness (generalized): Secondary | ICD-10-CM | POA: Diagnosis not present

## 2022-10-15 DIAGNOSIS — R2681 Unsteadiness on feet: Secondary | ICD-10-CM | POA: Diagnosis not present

## 2022-10-15 DIAGNOSIS — Z9181 History of falling: Secondary | ICD-10-CM | POA: Diagnosis not present

## 2022-10-15 DIAGNOSIS — M3500 Sicca syndrome, unspecified: Secondary | ICD-10-CM | POA: Diagnosis not present

## 2022-10-15 DIAGNOSIS — R41841 Cognitive communication deficit: Secondary | ICD-10-CM | POA: Diagnosis not present

## 2022-10-15 DIAGNOSIS — E039 Hypothyroidism, unspecified: Secondary | ICD-10-CM | POA: Diagnosis not present

## 2022-10-16 DIAGNOSIS — Z9181 History of falling: Secondary | ICD-10-CM | POA: Diagnosis not present

## 2022-10-16 DIAGNOSIS — R41841 Cognitive communication deficit: Secondary | ICD-10-CM | POA: Diagnosis not present

## 2022-10-16 DIAGNOSIS — R2681 Unsteadiness on feet: Secondary | ICD-10-CM | POA: Diagnosis not present

## 2022-10-16 DIAGNOSIS — M6281 Muscle weakness (generalized): Secondary | ICD-10-CM | POA: Diagnosis not present

## 2022-10-16 DIAGNOSIS — E039 Hypothyroidism, unspecified: Secondary | ICD-10-CM | POA: Diagnosis not present

## 2022-10-16 DIAGNOSIS — M3500 Sicca syndrome, unspecified: Secondary | ICD-10-CM | POA: Diagnosis not present

## 2022-10-18 DIAGNOSIS — M3500 Sicca syndrome, unspecified: Secondary | ICD-10-CM | POA: Diagnosis not present

## 2022-10-18 DIAGNOSIS — E039 Hypothyroidism, unspecified: Secondary | ICD-10-CM | POA: Diagnosis not present

## 2022-10-18 DIAGNOSIS — Z9181 History of falling: Secondary | ICD-10-CM | POA: Diagnosis not present

## 2022-10-18 DIAGNOSIS — R41841 Cognitive communication deficit: Secondary | ICD-10-CM | POA: Diagnosis not present

## 2022-10-18 DIAGNOSIS — R2681 Unsteadiness on feet: Secondary | ICD-10-CM | POA: Diagnosis not present

## 2022-10-18 DIAGNOSIS — M6281 Muscle weakness (generalized): Secondary | ICD-10-CM | POA: Diagnosis not present

## 2022-10-22 DIAGNOSIS — M3500 Sicca syndrome, unspecified: Secondary | ICD-10-CM | POA: Diagnosis not present

## 2022-10-22 DIAGNOSIS — M6281 Muscle weakness (generalized): Secondary | ICD-10-CM | POA: Diagnosis not present

## 2022-10-22 DIAGNOSIS — R41841 Cognitive communication deficit: Secondary | ICD-10-CM | POA: Diagnosis not present

## 2022-10-22 DIAGNOSIS — Z9181 History of falling: Secondary | ICD-10-CM | POA: Diagnosis not present

## 2022-10-22 DIAGNOSIS — E039 Hypothyroidism, unspecified: Secondary | ICD-10-CM | POA: Diagnosis not present

## 2022-10-22 DIAGNOSIS — R2681 Unsteadiness on feet: Secondary | ICD-10-CM | POA: Diagnosis not present

## 2022-10-24 DIAGNOSIS — R2681 Unsteadiness on feet: Secondary | ICD-10-CM | POA: Diagnosis not present

## 2022-10-24 DIAGNOSIS — M3500 Sicca syndrome, unspecified: Secondary | ICD-10-CM | POA: Diagnosis not present

## 2022-10-24 DIAGNOSIS — M6281 Muscle weakness (generalized): Secondary | ICD-10-CM | POA: Diagnosis not present

## 2022-10-24 DIAGNOSIS — R41841 Cognitive communication deficit: Secondary | ICD-10-CM | POA: Diagnosis not present

## 2022-10-24 DIAGNOSIS — E039 Hypothyroidism, unspecified: Secondary | ICD-10-CM | POA: Diagnosis not present

## 2022-10-24 DIAGNOSIS — Z9181 History of falling: Secondary | ICD-10-CM | POA: Diagnosis not present

## 2022-10-25 DIAGNOSIS — M3500 Sicca syndrome, unspecified: Secondary | ICD-10-CM | POA: Diagnosis not present

## 2022-10-25 DIAGNOSIS — R41841 Cognitive communication deficit: Secondary | ICD-10-CM | POA: Diagnosis not present

## 2022-10-25 DIAGNOSIS — E039 Hypothyroidism, unspecified: Secondary | ICD-10-CM | POA: Diagnosis not present

## 2022-10-25 DIAGNOSIS — Z9181 History of falling: Secondary | ICD-10-CM | POA: Diagnosis not present

## 2022-10-25 DIAGNOSIS — R2681 Unsteadiness on feet: Secondary | ICD-10-CM | POA: Diagnosis not present

## 2022-10-25 DIAGNOSIS — M6281 Muscle weakness (generalized): Secondary | ICD-10-CM | POA: Diagnosis not present

## 2022-10-29 DIAGNOSIS — Z9181 History of falling: Secondary | ICD-10-CM | POA: Diagnosis not present

## 2022-10-29 DIAGNOSIS — E039 Hypothyroidism, unspecified: Secondary | ICD-10-CM | POA: Diagnosis not present

## 2022-10-29 DIAGNOSIS — M6281 Muscle weakness (generalized): Secondary | ICD-10-CM | POA: Diagnosis not present

## 2022-10-29 DIAGNOSIS — R2681 Unsteadiness on feet: Secondary | ICD-10-CM | POA: Diagnosis not present

## 2022-10-29 DIAGNOSIS — R41841 Cognitive communication deficit: Secondary | ICD-10-CM | POA: Diagnosis not present

## 2022-10-29 DIAGNOSIS — M3500 Sicca syndrome, unspecified: Secondary | ICD-10-CM | POA: Diagnosis not present

## 2022-10-30 DIAGNOSIS — M3500 Sicca syndrome, unspecified: Secondary | ICD-10-CM | POA: Diagnosis not present

## 2022-10-30 DIAGNOSIS — Z9181 History of falling: Secondary | ICD-10-CM | POA: Diagnosis not present

## 2022-10-30 DIAGNOSIS — E039 Hypothyroidism, unspecified: Secondary | ICD-10-CM | POA: Diagnosis not present

## 2022-10-30 DIAGNOSIS — R41841 Cognitive communication deficit: Secondary | ICD-10-CM | POA: Diagnosis not present

## 2022-10-30 DIAGNOSIS — M6281 Muscle weakness (generalized): Secondary | ICD-10-CM | POA: Diagnosis not present

## 2022-10-30 DIAGNOSIS — R2681 Unsteadiness on feet: Secondary | ICD-10-CM | POA: Diagnosis not present

## 2022-10-31 DIAGNOSIS — M3500 Sicca syndrome, unspecified: Secondary | ICD-10-CM | POA: Diagnosis not present

## 2022-10-31 DIAGNOSIS — Z9181 History of falling: Secondary | ICD-10-CM | POA: Diagnosis not present

## 2022-10-31 DIAGNOSIS — R41841 Cognitive communication deficit: Secondary | ICD-10-CM | POA: Diagnosis not present

## 2022-10-31 DIAGNOSIS — R2681 Unsteadiness on feet: Secondary | ICD-10-CM | POA: Diagnosis not present

## 2022-10-31 DIAGNOSIS — E039 Hypothyroidism, unspecified: Secondary | ICD-10-CM | POA: Diagnosis not present

## 2022-10-31 DIAGNOSIS — M6281 Muscle weakness (generalized): Secondary | ICD-10-CM | POA: Diagnosis not present

## 2022-11-04 DIAGNOSIS — E039 Hypothyroidism, unspecified: Secondary | ICD-10-CM | POA: Diagnosis not present

## 2022-11-04 DIAGNOSIS — M6281 Muscle weakness (generalized): Secondary | ICD-10-CM | POA: Diagnosis not present

## 2022-11-04 DIAGNOSIS — R2681 Unsteadiness on feet: Secondary | ICD-10-CM | POA: Diagnosis not present

## 2022-11-04 DIAGNOSIS — M3500 Sicca syndrome, unspecified: Secondary | ICD-10-CM | POA: Diagnosis not present

## 2022-11-04 DIAGNOSIS — Z9181 History of falling: Secondary | ICD-10-CM | POA: Diagnosis not present

## 2022-11-04 DIAGNOSIS — R41841 Cognitive communication deficit: Secondary | ICD-10-CM | POA: Diagnosis not present

## 2022-11-07 DIAGNOSIS — M3500 Sicca syndrome, unspecified: Secondary | ICD-10-CM | POA: Diagnosis not present

## 2022-11-07 DIAGNOSIS — E039 Hypothyroidism, unspecified: Secondary | ICD-10-CM | POA: Diagnosis not present

## 2022-11-07 DIAGNOSIS — R41841 Cognitive communication deficit: Secondary | ICD-10-CM | POA: Diagnosis not present

## 2022-11-07 DIAGNOSIS — M6281 Muscle weakness (generalized): Secondary | ICD-10-CM | POA: Diagnosis not present

## 2022-11-07 DIAGNOSIS — R2681 Unsteadiness on feet: Secondary | ICD-10-CM | POA: Diagnosis not present

## 2022-11-07 DIAGNOSIS — Z9181 History of falling: Secondary | ICD-10-CM | POA: Diagnosis not present

## 2022-11-08 DIAGNOSIS — Z9181 History of falling: Secondary | ICD-10-CM | POA: Diagnosis not present

## 2022-11-08 DIAGNOSIS — L931 Subacute cutaneous lupus erythematosus: Secondary | ICD-10-CM | POA: Diagnosis not present

## 2022-11-08 DIAGNOSIS — R41841 Cognitive communication deficit: Secondary | ICD-10-CM | POA: Diagnosis not present

## 2022-11-08 DIAGNOSIS — R278 Other lack of coordination: Secondary | ICD-10-CM | POA: Diagnosis not present

## 2022-11-08 DIAGNOSIS — G309 Alzheimer's disease, unspecified: Secondary | ICD-10-CM | POA: Diagnosis not present

## 2022-11-08 DIAGNOSIS — M6281 Muscle weakness (generalized): Secondary | ICD-10-CM | POA: Diagnosis not present

## 2022-11-08 DIAGNOSIS — R2681 Unsteadiness on feet: Secondary | ICD-10-CM | POA: Diagnosis not present

## 2022-11-08 DIAGNOSIS — M3500 Sicca syndrome, unspecified: Secondary | ICD-10-CM | POA: Diagnosis not present

## 2022-11-08 DIAGNOSIS — E039 Hypothyroidism, unspecified: Secondary | ICD-10-CM | POA: Diagnosis not present

## 2022-11-08 DIAGNOSIS — E782 Mixed hyperlipidemia: Secondary | ICD-10-CM | POA: Diagnosis not present

## 2022-11-12 DIAGNOSIS — E039 Hypothyroidism, unspecified: Secondary | ICD-10-CM | POA: Diagnosis not present

## 2022-11-12 DIAGNOSIS — R2681 Unsteadiness on feet: Secondary | ICD-10-CM | POA: Diagnosis not present

## 2022-11-12 DIAGNOSIS — R41841 Cognitive communication deficit: Secondary | ICD-10-CM | POA: Diagnosis not present

## 2022-11-12 DIAGNOSIS — M3500 Sicca syndrome, unspecified: Secondary | ICD-10-CM | POA: Diagnosis not present

## 2022-11-12 DIAGNOSIS — Z9181 History of falling: Secondary | ICD-10-CM | POA: Diagnosis not present

## 2022-11-12 DIAGNOSIS — M6281 Muscle weakness (generalized): Secondary | ICD-10-CM | POA: Diagnosis not present

## 2022-11-13 DIAGNOSIS — Z9181 History of falling: Secondary | ICD-10-CM | POA: Diagnosis not present

## 2022-11-13 DIAGNOSIS — E039 Hypothyroidism, unspecified: Secondary | ICD-10-CM | POA: Diagnosis not present

## 2022-11-13 DIAGNOSIS — M3500 Sicca syndrome, unspecified: Secondary | ICD-10-CM | POA: Diagnosis not present

## 2022-11-13 DIAGNOSIS — R2681 Unsteadiness on feet: Secondary | ICD-10-CM | POA: Diagnosis not present

## 2022-11-13 DIAGNOSIS — R41841 Cognitive communication deficit: Secondary | ICD-10-CM | POA: Diagnosis not present

## 2022-11-13 DIAGNOSIS — M6281 Muscle weakness (generalized): Secondary | ICD-10-CM | POA: Diagnosis not present

## 2022-11-14 DIAGNOSIS — M3500 Sicca syndrome, unspecified: Secondary | ICD-10-CM | POA: Diagnosis not present

## 2022-11-14 DIAGNOSIS — R41841 Cognitive communication deficit: Secondary | ICD-10-CM | POA: Diagnosis not present

## 2022-11-14 DIAGNOSIS — M6281 Muscle weakness (generalized): Secondary | ICD-10-CM | POA: Diagnosis not present

## 2022-11-14 DIAGNOSIS — E039 Hypothyroidism, unspecified: Secondary | ICD-10-CM | POA: Diagnosis not present

## 2022-11-14 DIAGNOSIS — Z9181 History of falling: Secondary | ICD-10-CM | POA: Diagnosis not present

## 2022-11-14 DIAGNOSIS — R2681 Unsteadiness on feet: Secondary | ICD-10-CM | POA: Diagnosis not present

## 2022-11-15 DIAGNOSIS — M3500 Sicca syndrome, unspecified: Secondary | ICD-10-CM | POA: Diagnosis not present

## 2022-11-15 DIAGNOSIS — R2681 Unsteadiness on feet: Secondary | ICD-10-CM | POA: Diagnosis not present

## 2022-11-15 DIAGNOSIS — R41841 Cognitive communication deficit: Secondary | ICD-10-CM | POA: Diagnosis not present

## 2022-11-15 DIAGNOSIS — E039 Hypothyroidism, unspecified: Secondary | ICD-10-CM | POA: Diagnosis not present

## 2022-11-15 DIAGNOSIS — Z9181 History of falling: Secondary | ICD-10-CM | POA: Diagnosis not present

## 2022-11-15 DIAGNOSIS — M6281 Muscle weakness (generalized): Secondary | ICD-10-CM | POA: Diagnosis not present

## 2022-11-19 DIAGNOSIS — Z9181 History of falling: Secondary | ICD-10-CM | POA: Diagnosis not present

## 2022-11-19 DIAGNOSIS — R41841 Cognitive communication deficit: Secondary | ICD-10-CM | POA: Diagnosis not present

## 2022-11-19 DIAGNOSIS — E039 Hypothyroidism, unspecified: Secondary | ICD-10-CM | POA: Diagnosis not present

## 2022-11-19 DIAGNOSIS — M6281 Muscle weakness (generalized): Secondary | ICD-10-CM | POA: Diagnosis not present

## 2022-11-19 DIAGNOSIS — R2681 Unsteadiness on feet: Secondary | ICD-10-CM | POA: Diagnosis not present

## 2022-11-19 DIAGNOSIS — M3500 Sicca syndrome, unspecified: Secondary | ICD-10-CM | POA: Diagnosis not present

## 2022-11-21 DIAGNOSIS — M3500 Sicca syndrome, unspecified: Secondary | ICD-10-CM | POA: Diagnosis not present

## 2022-11-21 DIAGNOSIS — R2681 Unsteadiness on feet: Secondary | ICD-10-CM | POA: Diagnosis not present

## 2022-11-21 DIAGNOSIS — R41841 Cognitive communication deficit: Secondary | ICD-10-CM | POA: Diagnosis not present

## 2022-11-21 DIAGNOSIS — Z9181 History of falling: Secondary | ICD-10-CM | POA: Diagnosis not present

## 2022-11-21 DIAGNOSIS — E039 Hypothyroidism, unspecified: Secondary | ICD-10-CM | POA: Diagnosis not present

## 2022-11-21 DIAGNOSIS — M6281 Muscle weakness (generalized): Secondary | ICD-10-CM | POA: Diagnosis not present

## 2022-11-26 DIAGNOSIS — M6281 Muscle weakness (generalized): Secondary | ICD-10-CM | POA: Diagnosis not present

## 2022-11-26 DIAGNOSIS — R2681 Unsteadiness on feet: Secondary | ICD-10-CM | POA: Diagnosis not present

## 2022-11-26 DIAGNOSIS — Z9181 History of falling: Secondary | ICD-10-CM | POA: Diagnosis not present

## 2022-11-26 DIAGNOSIS — R41841 Cognitive communication deficit: Secondary | ICD-10-CM | POA: Diagnosis not present

## 2022-11-26 DIAGNOSIS — M3500 Sicca syndrome, unspecified: Secondary | ICD-10-CM | POA: Diagnosis not present

## 2022-11-26 DIAGNOSIS — E039 Hypothyroidism, unspecified: Secondary | ICD-10-CM | POA: Diagnosis not present

## 2022-11-28 DIAGNOSIS — M6281 Muscle weakness (generalized): Secondary | ICD-10-CM | POA: Diagnosis not present

## 2022-11-28 DIAGNOSIS — R2681 Unsteadiness on feet: Secondary | ICD-10-CM | POA: Diagnosis not present

## 2022-11-28 DIAGNOSIS — M3500 Sicca syndrome, unspecified: Secondary | ICD-10-CM | POA: Diagnosis not present

## 2022-11-28 DIAGNOSIS — R41841 Cognitive communication deficit: Secondary | ICD-10-CM | POA: Diagnosis not present

## 2022-11-28 DIAGNOSIS — E039 Hypothyroidism, unspecified: Secondary | ICD-10-CM | POA: Diagnosis not present

## 2022-11-28 DIAGNOSIS — Z9181 History of falling: Secondary | ICD-10-CM | POA: Diagnosis not present

## 2022-11-29 DIAGNOSIS — R2681 Unsteadiness on feet: Secondary | ICD-10-CM | POA: Diagnosis not present

## 2022-11-29 DIAGNOSIS — Z9181 History of falling: Secondary | ICD-10-CM | POA: Diagnosis not present

## 2022-11-29 DIAGNOSIS — E039 Hypothyroidism, unspecified: Secondary | ICD-10-CM | POA: Diagnosis not present

## 2022-11-29 DIAGNOSIS — R41841 Cognitive communication deficit: Secondary | ICD-10-CM | POA: Diagnosis not present

## 2022-11-29 DIAGNOSIS — M6281 Muscle weakness (generalized): Secondary | ICD-10-CM | POA: Diagnosis not present

## 2022-11-29 DIAGNOSIS — M3500 Sicca syndrome, unspecified: Secondary | ICD-10-CM | POA: Diagnosis not present

## 2022-12-02 DIAGNOSIS — R41841 Cognitive communication deficit: Secondary | ICD-10-CM | POA: Diagnosis not present

## 2022-12-02 DIAGNOSIS — M3500 Sicca syndrome, unspecified: Secondary | ICD-10-CM | POA: Diagnosis not present

## 2022-12-02 DIAGNOSIS — R2681 Unsteadiness on feet: Secondary | ICD-10-CM | POA: Diagnosis not present

## 2022-12-02 DIAGNOSIS — Z9181 History of falling: Secondary | ICD-10-CM | POA: Diagnosis not present

## 2022-12-02 DIAGNOSIS — M6281 Muscle weakness (generalized): Secondary | ICD-10-CM | POA: Diagnosis not present

## 2022-12-02 DIAGNOSIS — E039 Hypothyroidism, unspecified: Secondary | ICD-10-CM | POA: Diagnosis not present

## 2022-12-03 DIAGNOSIS — E039 Hypothyroidism, unspecified: Secondary | ICD-10-CM | POA: Diagnosis not present

## 2022-12-03 DIAGNOSIS — Z9181 History of falling: Secondary | ICD-10-CM | POA: Diagnosis not present

## 2022-12-03 DIAGNOSIS — M3500 Sicca syndrome, unspecified: Secondary | ICD-10-CM | POA: Diagnosis not present

## 2022-12-03 DIAGNOSIS — R41841 Cognitive communication deficit: Secondary | ICD-10-CM | POA: Diagnosis not present

## 2022-12-03 DIAGNOSIS — M6281 Muscle weakness (generalized): Secondary | ICD-10-CM | POA: Diagnosis not present

## 2022-12-03 DIAGNOSIS — R2681 Unsteadiness on feet: Secondary | ICD-10-CM | POA: Diagnosis not present

## 2022-12-06 DIAGNOSIS — E039 Hypothyroidism, unspecified: Secondary | ICD-10-CM | POA: Diagnosis not present

## 2022-12-06 DIAGNOSIS — R2681 Unsteadiness on feet: Secondary | ICD-10-CM | POA: Diagnosis not present

## 2022-12-06 DIAGNOSIS — R41841 Cognitive communication deficit: Secondary | ICD-10-CM | POA: Diagnosis not present

## 2022-12-06 DIAGNOSIS — Z9181 History of falling: Secondary | ICD-10-CM | POA: Diagnosis not present

## 2022-12-06 DIAGNOSIS — M6281 Muscle weakness (generalized): Secondary | ICD-10-CM | POA: Diagnosis not present

## 2022-12-06 DIAGNOSIS — M3500 Sicca syndrome, unspecified: Secondary | ICD-10-CM | POA: Diagnosis not present

## 2022-12-09 DIAGNOSIS — L931 Subacute cutaneous lupus erythematosus: Secondary | ICD-10-CM | POA: Diagnosis not present

## 2022-12-09 DIAGNOSIS — E46 Unspecified protein-calorie malnutrition: Secondary | ICD-10-CM | POA: Diagnosis not present

## 2022-12-09 DIAGNOSIS — R2681 Unsteadiness on feet: Secondary | ICD-10-CM | POA: Diagnosis not present

## 2022-12-09 DIAGNOSIS — R41841 Cognitive communication deficit: Secondary | ICD-10-CM | POA: Diagnosis not present

## 2022-12-09 DIAGNOSIS — R278 Other lack of coordination: Secondary | ICD-10-CM | POA: Diagnosis not present

## 2022-12-09 DIAGNOSIS — M6281 Muscle weakness (generalized): Secondary | ICD-10-CM | POA: Diagnosis not present

## 2022-12-09 DIAGNOSIS — G309 Alzheimer's disease, unspecified: Secondary | ICD-10-CM | POA: Diagnosis not present

## 2022-12-09 DIAGNOSIS — Z9181 History of falling: Secondary | ICD-10-CM | POA: Diagnosis not present

## 2022-12-09 DIAGNOSIS — M3500 Sicca syndrome, unspecified: Secondary | ICD-10-CM | POA: Diagnosis not present

## 2022-12-09 DIAGNOSIS — E782 Mixed hyperlipidemia: Secondary | ICD-10-CM | POA: Diagnosis not present

## 2022-12-09 DIAGNOSIS — E039 Hypothyroidism, unspecified: Secondary | ICD-10-CM | POA: Diagnosis not present

## 2022-12-10 DIAGNOSIS — R2681 Unsteadiness on feet: Secondary | ICD-10-CM | POA: Diagnosis not present

## 2022-12-10 DIAGNOSIS — E039 Hypothyroidism, unspecified: Secondary | ICD-10-CM | POA: Diagnosis not present

## 2022-12-10 DIAGNOSIS — R41841 Cognitive communication deficit: Secondary | ICD-10-CM | POA: Diagnosis not present

## 2022-12-10 DIAGNOSIS — Z9181 History of falling: Secondary | ICD-10-CM | POA: Diagnosis not present

## 2022-12-10 DIAGNOSIS — M3500 Sicca syndrome, unspecified: Secondary | ICD-10-CM | POA: Diagnosis not present

## 2022-12-10 DIAGNOSIS — M6281 Muscle weakness (generalized): Secondary | ICD-10-CM | POA: Diagnosis not present

## 2022-12-12 DIAGNOSIS — R2681 Unsteadiness on feet: Secondary | ICD-10-CM | POA: Diagnosis not present

## 2022-12-12 DIAGNOSIS — M3500 Sicca syndrome, unspecified: Secondary | ICD-10-CM | POA: Diagnosis not present

## 2022-12-12 DIAGNOSIS — R41841 Cognitive communication deficit: Secondary | ICD-10-CM | POA: Diagnosis not present

## 2022-12-12 DIAGNOSIS — Z9181 History of falling: Secondary | ICD-10-CM | POA: Diagnosis not present

## 2022-12-12 DIAGNOSIS — M6281 Muscle weakness (generalized): Secondary | ICD-10-CM | POA: Diagnosis not present

## 2022-12-12 DIAGNOSIS — E039 Hypothyroidism, unspecified: Secondary | ICD-10-CM | POA: Diagnosis not present

## 2022-12-13 DIAGNOSIS — R2681 Unsteadiness on feet: Secondary | ICD-10-CM | POA: Diagnosis not present

## 2022-12-13 DIAGNOSIS — M3500 Sicca syndrome, unspecified: Secondary | ICD-10-CM | POA: Diagnosis not present

## 2022-12-13 DIAGNOSIS — M6281 Muscle weakness (generalized): Secondary | ICD-10-CM | POA: Diagnosis not present

## 2022-12-13 DIAGNOSIS — Z9181 History of falling: Secondary | ICD-10-CM | POA: Diagnosis not present

## 2022-12-13 DIAGNOSIS — R41841 Cognitive communication deficit: Secondary | ICD-10-CM | POA: Diagnosis not present

## 2022-12-13 DIAGNOSIS — E039 Hypothyroidism, unspecified: Secondary | ICD-10-CM | POA: Diagnosis not present

## 2022-12-15 DIAGNOSIS — Z9181 History of falling: Secondary | ICD-10-CM | POA: Diagnosis not present

## 2022-12-15 DIAGNOSIS — E039 Hypothyroidism, unspecified: Secondary | ICD-10-CM | POA: Diagnosis not present

## 2022-12-15 DIAGNOSIS — M6281 Muscle weakness (generalized): Secondary | ICD-10-CM | POA: Diagnosis not present

## 2022-12-15 DIAGNOSIS — M3500 Sicca syndrome, unspecified: Secondary | ICD-10-CM | POA: Diagnosis not present

## 2022-12-15 DIAGNOSIS — R41841 Cognitive communication deficit: Secondary | ICD-10-CM | POA: Diagnosis not present

## 2022-12-15 DIAGNOSIS — R2681 Unsteadiness on feet: Secondary | ICD-10-CM | POA: Diagnosis not present

## 2022-12-16 DIAGNOSIS — R2681 Unsteadiness on feet: Secondary | ICD-10-CM | POA: Diagnosis not present

## 2022-12-16 DIAGNOSIS — M3500 Sicca syndrome, unspecified: Secondary | ICD-10-CM | POA: Diagnosis not present

## 2022-12-16 DIAGNOSIS — Z9181 History of falling: Secondary | ICD-10-CM | POA: Diagnosis not present

## 2022-12-16 DIAGNOSIS — E039 Hypothyroidism, unspecified: Secondary | ICD-10-CM | POA: Diagnosis not present

## 2022-12-16 DIAGNOSIS — R41841 Cognitive communication deficit: Secondary | ICD-10-CM | POA: Diagnosis not present

## 2022-12-16 DIAGNOSIS — M6281 Muscle weakness (generalized): Secondary | ICD-10-CM | POA: Diagnosis not present

## 2022-12-17 DIAGNOSIS — M6281 Muscle weakness (generalized): Secondary | ICD-10-CM | POA: Diagnosis not present

## 2022-12-17 DIAGNOSIS — R41841 Cognitive communication deficit: Secondary | ICD-10-CM | POA: Diagnosis not present

## 2022-12-17 DIAGNOSIS — R2681 Unsteadiness on feet: Secondary | ICD-10-CM | POA: Diagnosis not present

## 2022-12-17 DIAGNOSIS — E039 Hypothyroidism, unspecified: Secondary | ICD-10-CM | POA: Diagnosis not present

## 2022-12-17 DIAGNOSIS — M3500 Sicca syndrome, unspecified: Secondary | ICD-10-CM | POA: Diagnosis not present

## 2022-12-17 DIAGNOSIS — Z9181 History of falling: Secondary | ICD-10-CM | POA: Diagnosis not present

## 2022-12-20 DIAGNOSIS — R41841 Cognitive communication deficit: Secondary | ICD-10-CM | POA: Diagnosis not present

## 2022-12-20 DIAGNOSIS — M3500 Sicca syndrome, unspecified: Secondary | ICD-10-CM | POA: Diagnosis not present

## 2022-12-20 DIAGNOSIS — E039 Hypothyroidism, unspecified: Secondary | ICD-10-CM | POA: Diagnosis not present

## 2022-12-20 DIAGNOSIS — R2681 Unsteadiness on feet: Secondary | ICD-10-CM | POA: Diagnosis not present

## 2022-12-20 DIAGNOSIS — M6281 Muscle weakness (generalized): Secondary | ICD-10-CM | POA: Diagnosis not present

## 2022-12-20 DIAGNOSIS — Z9181 History of falling: Secondary | ICD-10-CM | POA: Diagnosis not present

## 2022-12-23 DIAGNOSIS — Z9181 History of falling: Secondary | ICD-10-CM | POA: Diagnosis not present

## 2022-12-23 DIAGNOSIS — R41841 Cognitive communication deficit: Secondary | ICD-10-CM | POA: Diagnosis not present

## 2022-12-23 DIAGNOSIS — E039 Hypothyroidism, unspecified: Secondary | ICD-10-CM | POA: Diagnosis not present

## 2022-12-23 DIAGNOSIS — R2681 Unsteadiness on feet: Secondary | ICD-10-CM | POA: Diagnosis not present

## 2022-12-23 DIAGNOSIS — M3500 Sicca syndrome, unspecified: Secondary | ICD-10-CM | POA: Diagnosis not present

## 2022-12-23 DIAGNOSIS — M6281 Muscle weakness (generalized): Secondary | ICD-10-CM | POA: Diagnosis not present

## 2022-12-24 ENCOUNTER — Inpatient Hospital Stay: Admission: RE | Admit: 2022-12-24 | Payer: Medicare Other | Source: Ambulatory Visit

## 2022-12-24 DIAGNOSIS — M6281 Muscle weakness (generalized): Secondary | ICD-10-CM | POA: Diagnosis not present

## 2022-12-24 DIAGNOSIS — R2681 Unsteadiness on feet: Secondary | ICD-10-CM | POA: Diagnosis not present

## 2022-12-24 DIAGNOSIS — R41841 Cognitive communication deficit: Secondary | ICD-10-CM | POA: Diagnosis not present

## 2022-12-24 DIAGNOSIS — E039 Hypothyroidism, unspecified: Secondary | ICD-10-CM | POA: Diagnosis not present

## 2022-12-24 DIAGNOSIS — Z9181 History of falling: Secondary | ICD-10-CM | POA: Diagnosis not present

## 2022-12-24 DIAGNOSIS — M3500 Sicca syndrome, unspecified: Secondary | ICD-10-CM | POA: Diagnosis not present

## 2022-12-26 DIAGNOSIS — R2681 Unsteadiness on feet: Secondary | ICD-10-CM | POA: Diagnosis not present

## 2022-12-26 DIAGNOSIS — R41841 Cognitive communication deficit: Secondary | ICD-10-CM | POA: Diagnosis not present

## 2022-12-26 DIAGNOSIS — E039 Hypothyroidism, unspecified: Secondary | ICD-10-CM | POA: Diagnosis not present

## 2022-12-26 DIAGNOSIS — Z9181 History of falling: Secondary | ICD-10-CM | POA: Diagnosis not present

## 2022-12-26 DIAGNOSIS — M3500 Sicca syndrome, unspecified: Secondary | ICD-10-CM | POA: Diagnosis not present

## 2022-12-26 DIAGNOSIS — M6281 Muscle weakness (generalized): Secondary | ICD-10-CM | POA: Diagnosis not present

## 2022-12-29 DIAGNOSIS — E039 Hypothyroidism, unspecified: Secondary | ICD-10-CM | POA: Diagnosis not present

## 2022-12-29 DIAGNOSIS — R2681 Unsteadiness on feet: Secondary | ICD-10-CM | POA: Diagnosis not present

## 2022-12-29 DIAGNOSIS — Z9181 History of falling: Secondary | ICD-10-CM | POA: Diagnosis not present

## 2022-12-29 DIAGNOSIS — R41841 Cognitive communication deficit: Secondary | ICD-10-CM | POA: Diagnosis not present

## 2022-12-29 DIAGNOSIS — M3500 Sicca syndrome, unspecified: Secondary | ICD-10-CM | POA: Diagnosis not present

## 2022-12-29 DIAGNOSIS — M6281 Muscle weakness (generalized): Secondary | ICD-10-CM | POA: Diagnosis not present

## 2022-12-30 DIAGNOSIS — R2681 Unsteadiness on feet: Secondary | ICD-10-CM | POA: Diagnosis not present

## 2022-12-30 DIAGNOSIS — M6281 Muscle weakness (generalized): Secondary | ICD-10-CM | POA: Diagnosis not present

## 2022-12-30 DIAGNOSIS — E039 Hypothyroidism, unspecified: Secondary | ICD-10-CM | POA: Diagnosis not present

## 2022-12-30 DIAGNOSIS — Z9181 History of falling: Secondary | ICD-10-CM | POA: Diagnosis not present

## 2022-12-30 DIAGNOSIS — M3500 Sicca syndrome, unspecified: Secondary | ICD-10-CM | POA: Diagnosis not present

## 2022-12-30 DIAGNOSIS — R41841 Cognitive communication deficit: Secondary | ICD-10-CM | POA: Diagnosis not present

## 2023-01-02 DIAGNOSIS — Z9181 History of falling: Secondary | ICD-10-CM | POA: Diagnosis not present

## 2023-01-02 DIAGNOSIS — R2681 Unsteadiness on feet: Secondary | ICD-10-CM | POA: Diagnosis not present

## 2023-01-02 DIAGNOSIS — E039 Hypothyroidism, unspecified: Secondary | ICD-10-CM | POA: Diagnosis not present

## 2023-01-02 DIAGNOSIS — R41841 Cognitive communication deficit: Secondary | ICD-10-CM | POA: Diagnosis not present

## 2023-01-02 DIAGNOSIS — M6281 Muscle weakness (generalized): Secondary | ICD-10-CM | POA: Diagnosis not present

## 2023-01-02 DIAGNOSIS — M3500 Sicca syndrome, unspecified: Secondary | ICD-10-CM | POA: Diagnosis not present

## 2023-01-03 DIAGNOSIS — R41841 Cognitive communication deficit: Secondary | ICD-10-CM | POA: Diagnosis not present

## 2023-01-03 DIAGNOSIS — M6281 Muscle weakness (generalized): Secondary | ICD-10-CM | POA: Diagnosis not present

## 2023-01-03 DIAGNOSIS — M3500 Sicca syndrome, unspecified: Secondary | ICD-10-CM | POA: Diagnosis not present

## 2023-01-03 DIAGNOSIS — E039 Hypothyroidism, unspecified: Secondary | ICD-10-CM | POA: Diagnosis not present

## 2023-01-03 DIAGNOSIS — R2681 Unsteadiness on feet: Secondary | ICD-10-CM | POA: Diagnosis not present

## 2023-01-03 DIAGNOSIS — Z9181 History of falling: Secondary | ICD-10-CM | POA: Diagnosis not present

## 2023-01-06 DIAGNOSIS — Z9181 History of falling: Secondary | ICD-10-CM | POA: Diagnosis not present

## 2023-01-06 DIAGNOSIS — E039 Hypothyroidism, unspecified: Secondary | ICD-10-CM | POA: Diagnosis not present

## 2023-01-06 DIAGNOSIS — M6281 Muscle weakness (generalized): Secondary | ICD-10-CM | POA: Diagnosis not present

## 2023-01-06 DIAGNOSIS — R41841 Cognitive communication deficit: Secondary | ICD-10-CM | POA: Diagnosis not present

## 2023-01-06 DIAGNOSIS — M3500 Sicca syndrome, unspecified: Secondary | ICD-10-CM | POA: Diagnosis not present

## 2023-01-06 DIAGNOSIS — R2681 Unsteadiness on feet: Secondary | ICD-10-CM | POA: Diagnosis not present

## 2023-01-07 DIAGNOSIS — M3500 Sicca syndrome, unspecified: Secondary | ICD-10-CM | POA: Diagnosis not present

## 2023-01-07 DIAGNOSIS — R2681 Unsteadiness on feet: Secondary | ICD-10-CM | POA: Diagnosis not present

## 2023-01-07 DIAGNOSIS — R41841 Cognitive communication deficit: Secondary | ICD-10-CM | POA: Diagnosis not present

## 2023-01-07 DIAGNOSIS — Z9181 History of falling: Secondary | ICD-10-CM | POA: Diagnosis not present

## 2023-01-07 DIAGNOSIS — M6281 Muscle weakness (generalized): Secondary | ICD-10-CM | POA: Diagnosis not present

## 2023-01-07 DIAGNOSIS — E039 Hypothyroidism, unspecified: Secondary | ICD-10-CM | POA: Diagnosis not present

## 2023-01-09 DIAGNOSIS — E782 Mixed hyperlipidemia: Secondary | ICD-10-CM | POA: Diagnosis not present

## 2023-01-09 DIAGNOSIS — R2681 Unsteadiness on feet: Secondary | ICD-10-CM | POA: Diagnosis not present

## 2023-01-09 DIAGNOSIS — G309 Alzheimer's disease, unspecified: Secondary | ICD-10-CM | POA: Diagnosis not present

## 2023-01-09 DIAGNOSIS — R41841 Cognitive communication deficit: Secondary | ICD-10-CM | POA: Diagnosis not present

## 2023-01-09 DIAGNOSIS — M6281 Muscle weakness (generalized): Secondary | ICD-10-CM | POA: Diagnosis not present

## 2023-01-09 DIAGNOSIS — L931 Subacute cutaneous lupus erythematosus: Secondary | ICD-10-CM | POA: Diagnosis not present

## 2023-01-09 DIAGNOSIS — R278 Other lack of coordination: Secondary | ICD-10-CM | POA: Diagnosis not present

## 2023-01-09 DIAGNOSIS — M3500 Sicca syndrome, unspecified: Secondary | ICD-10-CM | POA: Diagnosis not present

## 2023-01-09 DIAGNOSIS — Z9181 History of falling: Secondary | ICD-10-CM | POA: Diagnosis not present

## 2023-01-09 DIAGNOSIS — E039 Hypothyroidism, unspecified: Secondary | ICD-10-CM | POA: Diagnosis not present

## 2023-01-10 DIAGNOSIS — M3500 Sicca syndrome, unspecified: Secondary | ICD-10-CM | POA: Diagnosis not present

## 2023-01-10 DIAGNOSIS — Z9181 History of falling: Secondary | ICD-10-CM | POA: Diagnosis not present

## 2023-01-10 DIAGNOSIS — R2681 Unsteadiness on feet: Secondary | ICD-10-CM | POA: Diagnosis not present

## 2023-01-10 DIAGNOSIS — M6281 Muscle weakness (generalized): Secondary | ICD-10-CM | POA: Diagnosis not present

## 2023-01-10 DIAGNOSIS — R278 Other lack of coordination: Secondary | ICD-10-CM | POA: Diagnosis not present

## 2023-01-10 DIAGNOSIS — R41841 Cognitive communication deficit: Secondary | ICD-10-CM | POA: Diagnosis not present

## 2023-01-13 DIAGNOSIS — M3500 Sicca syndrome, unspecified: Secondary | ICD-10-CM | POA: Diagnosis not present

## 2023-01-13 DIAGNOSIS — Z9181 History of falling: Secondary | ICD-10-CM | POA: Diagnosis not present

## 2023-01-13 DIAGNOSIS — R2681 Unsteadiness on feet: Secondary | ICD-10-CM | POA: Diagnosis not present

## 2023-01-13 DIAGNOSIS — R41841 Cognitive communication deficit: Secondary | ICD-10-CM | POA: Diagnosis not present

## 2023-01-13 DIAGNOSIS — M6281 Muscle weakness (generalized): Secondary | ICD-10-CM | POA: Diagnosis not present

## 2023-01-13 DIAGNOSIS — R278 Other lack of coordination: Secondary | ICD-10-CM | POA: Diagnosis not present

## 2023-01-14 DIAGNOSIS — Z9181 History of falling: Secondary | ICD-10-CM | POA: Diagnosis not present

## 2023-01-14 DIAGNOSIS — R2681 Unsteadiness on feet: Secondary | ICD-10-CM | POA: Diagnosis not present

## 2023-01-14 DIAGNOSIS — R278 Other lack of coordination: Secondary | ICD-10-CM | POA: Diagnosis not present

## 2023-01-14 DIAGNOSIS — M6281 Muscle weakness (generalized): Secondary | ICD-10-CM | POA: Diagnosis not present

## 2023-01-14 DIAGNOSIS — M3500 Sicca syndrome, unspecified: Secondary | ICD-10-CM | POA: Diagnosis not present

## 2023-01-14 DIAGNOSIS — R41841 Cognitive communication deficit: Secondary | ICD-10-CM | POA: Diagnosis not present

## 2023-01-15 DIAGNOSIS — M6281 Muscle weakness (generalized): Secondary | ICD-10-CM | POA: Diagnosis not present

## 2023-01-15 DIAGNOSIS — M3500 Sicca syndrome, unspecified: Secondary | ICD-10-CM | POA: Diagnosis not present

## 2023-01-15 DIAGNOSIS — R41841 Cognitive communication deficit: Secondary | ICD-10-CM | POA: Diagnosis not present

## 2023-01-15 DIAGNOSIS — R278 Other lack of coordination: Secondary | ICD-10-CM | POA: Diagnosis not present

## 2023-01-15 DIAGNOSIS — R2681 Unsteadiness on feet: Secondary | ICD-10-CM | POA: Diagnosis not present

## 2023-01-15 DIAGNOSIS — Z9181 History of falling: Secondary | ICD-10-CM | POA: Diagnosis not present

## 2023-01-16 DIAGNOSIS — M3500 Sicca syndrome, unspecified: Secondary | ICD-10-CM | POA: Diagnosis not present

## 2023-01-16 DIAGNOSIS — R278 Other lack of coordination: Secondary | ICD-10-CM | POA: Diagnosis not present

## 2023-01-16 DIAGNOSIS — Z9181 History of falling: Secondary | ICD-10-CM | POA: Diagnosis not present

## 2023-01-16 DIAGNOSIS — M6281 Muscle weakness (generalized): Secondary | ICD-10-CM | POA: Diagnosis not present

## 2023-01-16 DIAGNOSIS — R2681 Unsteadiness on feet: Secondary | ICD-10-CM | POA: Diagnosis not present

## 2023-01-16 DIAGNOSIS — R41841 Cognitive communication deficit: Secondary | ICD-10-CM | POA: Diagnosis not present

## 2023-01-17 DIAGNOSIS — R278 Other lack of coordination: Secondary | ICD-10-CM | POA: Diagnosis not present

## 2023-01-17 DIAGNOSIS — R2681 Unsteadiness on feet: Secondary | ICD-10-CM | POA: Diagnosis not present

## 2023-01-17 DIAGNOSIS — R41841 Cognitive communication deficit: Secondary | ICD-10-CM | POA: Diagnosis not present

## 2023-01-17 DIAGNOSIS — M3500 Sicca syndrome, unspecified: Secondary | ICD-10-CM | POA: Diagnosis not present

## 2023-01-17 DIAGNOSIS — Z9181 History of falling: Secondary | ICD-10-CM | POA: Diagnosis not present

## 2023-01-17 DIAGNOSIS — M6281 Muscle weakness (generalized): Secondary | ICD-10-CM | POA: Diagnosis not present

## 2023-01-20 DIAGNOSIS — M3500 Sicca syndrome, unspecified: Secondary | ICD-10-CM | POA: Diagnosis not present

## 2023-01-20 DIAGNOSIS — Z9181 History of falling: Secondary | ICD-10-CM | POA: Diagnosis not present

## 2023-01-20 DIAGNOSIS — R41841 Cognitive communication deficit: Secondary | ICD-10-CM | POA: Diagnosis not present

## 2023-01-20 DIAGNOSIS — M6281 Muscle weakness (generalized): Secondary | ICD-10-CM | POA: Diagnosis not present

## 2023-01-20 DIAGNOSIS — R278 Other lack of coordination: Secondary | ICD-10-CM | POA: Diagnosis not present

## 2023-01-20 DIAGNOSIS — R2681 Unsteadiness on feet: Secondary | ICD-10-CM | POA: Diagnosis not present

## 2023-01-21 DIAGNOSIS — R278 Other lack of coordination: Secondary | ICD-10-CM | POA: Diagnosis not present

## 2023-01-21 DIAGNOSIS — R41841 Cognitive communication deficit: Secondary | ICD-10-CM | POA: Diagnosis not present

## 2023-01-21 DIAGNOSIS — M6281 Muscle weakness (generalized): Secondary | ICD-10-CM | POA: Diagnosis not present

## 2023-01-21 DIAGNOSIS — Z9181 History of falling: Secondary | ICD-10-CM | POA: Diagnosis not present

## 2023-01-21 DIAGNOSIS — M3500 Sicca syndrome, unspecified: Secondary | ICD-10-CM | POA: Diagnosis not present

## 2023-01-21 DIAGNOSIS — R2681 Unsteadiness on feet: Secondary | ICD-10-CM | POA: Diagnosis not present

## 2023-01-22 DIAGNOSIS — R41841 Cognitive communication deficit: Secondary | ICD-10-CM | POA: Diagnosis not present

## 2023-01-22 DIAGNOSIS — R2681 Unsteadiness on feet: Secondary | ICD-10-CM | POA: Diagnosis not present

## 2023-01-22 DIAGNOSIS — R278 Other lack of coordination: Secondary | ICD-10-CM | POA: Diagnosis not present

## 2023-01-22 DIAGNOSIS — M6281 Muscle weakness (generalized): Secondary | ICD-10-CM | POA: Diagnosis not present

## 2023-01-22 DIAGNOSIS — Z9181 History of falling: Secondary | ICD-10-CM | POA: Diagnosis not present

## 2023-01-22 DIAGNOSIS — M3500 Sicca syndrome, unspecified: Secondary | ICD-10-CM | POA: Diagnosis not present

## 2023-01-23 DIAGNOSIS — R2681 Unsteadiness on feet: Secondary | ICD-10-CM | POA: Diagnosis not present

## 2023-01-23 DIAGNOSIS — R278 Other lack of coordination: Secondary | ICD-10-CM | POA: Diagnosis not present

## 2023-01-23 DIAGNOSIS — M6281 Muscle weakness (generalized): Secondary | ICD-10-CM | POA: Diagnosis not present

## 2023-01-23 DIAGNOSIS — Z9181 History of falling: Secondary | ICD-10-CM | POA: Diagnosis not present

## 2023-01-23 DIAGNOSIS — M3500 Sicca syndrome, unspecified: Secondary | ICD-10-CM | POA: Diagnosis not present

## 2023-01-23 DIAGNOSIS — R41841 Cognitive communication deficit: Secondary | ICD-10-CM | POA: Diagnosis not present

## 2023-01-24 DIAGNOSIS — M3500 Sicca syndrome, unspecified: Secondary | ICD-10-CM | POA: Diagnosis not present

## 2023-01-24 DIAGNOSIS — Z9181 History of falling: Secondary | ICD-10-CM | POA: Diagnosis not present

## 2023-01-24 DIAGNOSIS — R278 Other lack of coordination: Secondary | ICD-10-CM | POA: Diagnosis not present

## 2023-01-24 DIAGNOSIS — M6281 Muscle weakness (generalized): Secondary | ICD-10-CM | POA: Diagnosis not present

## 2023-01-24 DIAGNOSIS — R2681 Unsteadiness on feet: Secondary | ICD-10-CM | POA: Diagnosis not present

## 2023-01-24 DIAGNOSIS — R41841 Cognitive communication deficit: Secondary | ICD-10-CM | POA: Diagnosis not present

## 2023-01-27 DIAGNOSIS — Z9181 History of falling: Secondary | ICD-10-CM | POA: Diagnosis not present

## 2023-01-27 DIAGNOSIS — R2681 Unsteadiness on feet: Secondary | ICD-10-CM | POA: Diagnosis not present

## 2023-01-27 DIAGNOSIS — M6281 Muscle weakness (generalized): Secondary | ICD-10-CM | POA: Diagnosis not present

## 2023-01-27 DIAGNOSIS — R41841 Cognitive communication deficit: Secondary | ICD-10-CM | POA: Diagnosis not present

## 2023-01-27 DIAGNOSIS — M3500 Sicca syndrome, unspecified: Secondary | ICD-10-CM | POA: Diagnosis not present

## 2023-01-27 DIAGNOSIS — R278 Other lack of coordination: Secondary | ICD-10-CM | POA: Diagnosis not present

## 2023-01-28 DIAGNOSIS — M3500 Sicca syndrome, unspecified: Secondary | ICD-10-CM | POA: Diagnosis not present

## 2023-01-28 DIAGNOSIS — R278 Other lack of coordination: Secondary | ICD-10-CM | POA: Diagnosis not present

## 2023-01-28 DIAGNOSIS — R2681 Unsteadiness on feet: Secondary | ICD-10-CM | POA: Diagnosis not present

## 2023-01-28 DIAGNOSIS — Z9181 History of falling: Secondary | ICD-10-CM | POA: Diagnosis not present

## 2023-01-28 DIAGNOSIS — M6281 Muscle weakness (generalized): Secondary | ICD-10-CM | POA: Diagnosis not present

## 2023-01-28 DIAGNOSIS — R41841 Cognitive communication deficit: Secondary | ICD-10-CM | POA: Diagnosis not present

## 2023-01-29 DIAGNOSIS — M3500 Sicca syndrome, unspecified: Secondary | ICD-10-CM | POA: Diagnosis not present

## 2023-01-29 DIAGNOSIS — R41841 Cognitive communication deficit: Secondary | ICD-10-CM | POA: Diagnosis not present

## 2023-01-29 DIAGNOSIS — Z9181 History of falling: Secondary | ICD-10-CM | POA: Diagnosis not present

## 2023-01-29 DIAGNOSIS — M6281 Muscle weakness (generalized): Secondary | ICD-10-CM | POA: Diagnosis not present

## 2023-01-29 DIAGNOSIS — R278 Other lack of coordination: Secondary | ICD-10-CM | POA: Diagnosis not present

## 2023-01-29 DIAGNOSIS — R2681 Unsteadiness on feet: Secondary | ICD-10-CM | POA: Diagnosis not present

## 2023-01-31 DIAGNOSIS — Z9181 History of falling: Secondary | ICD-10-CM | POA: Diagnosis not present

## 2023-01-31 DIAGNOSIS — M3500 Sicca syndrome, unspecified: Secondary | ICD-10-CM | POA: Diagnosis not present

## 2023-01-31 DIAGNOSIS — M6281 Muscle weakness (generalized): Secondary | ICD-10-CM | POA: Diagnosis not present

## 2023-01-31 DIAGNOSIS — R41841 Cognitive communication deficit: Secondary | ICD-10-CM | POA: Diagnosis not present

## 2023-01-31 DIAGNOSIS — R2681 Unsteadiness on feet: Secondary | ICD-10-CM | POA: Diagnosis not present

## 2023-01-31 DIAGNOSIS — R278 Other lack of coordination: Secondary | ICD-10-CM | POA: Diagnosis not present

## 2023-02-03 DIAGNOSIS — R41841 Cognitive communication deficit: Secondary | ICD-10-CM | POA: Diagnosis not present

## 2023-02-03 DIAGNOSIS — Z9181 History of falling: Secondary | ICD-10-CM | POA: Diagnosis not present

## 2023-02-03 DIAGNOSIS — R2681 Unsteadiness on feet: Secondary | ICD-10-CM | POA: Diagnosis not present

## 2023-02-03 DIAGNOSIS — M3500 Sicca syndrome, unspecified: Secondary | ICD-10-CM | POA: Diagnosis not present

## 2023-02-03 DIAGNOSIS — M6281 Muscle weakness (generalized): Secondary | ICD-10-CM | POA: Diagnosis not present

## 2023-02-03 DIAGNOSIS — R278 Other lack of coordination: Secondary | ICD-10-CM | POA: Diagnosis not present

## 2023-02-04 DIAGNOSIS — Z9181 History of falling: Secondary | ICD-10-CM | POA: Diagnosis not present

## 2023-02-04 DIAGNOSIS — M6281 Muscle weakness (generalized): Secondary | ICD-10-CM | POA: Diagnosis not present

## 2023-02-04 DIAGNOSIS — R41841 Cognitive communication deficit: Secondary | ICD-10-CM | POA: Diagnosis not present

## 2023-02-04 DIAGNOSIS — R278 Other lack of coordination: Secondary | ICD-10-CM | POA: Diagnosis not present

## 2023-02-04 DIAGNOSIS — M3500 Sicca syndrome, unspecified: Secondary | ICD-10-CM | POA: Diagnosis not present

## 2023-02-04 DIAGNOSIS — R2681 Unsteadiness on feet: Secondary | ICD-10-CM | POA: Diagnosis not present

## 2023-02-05 DIAGNOSIS — R41841 Cognitive communication deficit: Secondary | ICD-10-CM | POA: Diagnosis not present

## 2023-02-05 DIAGNOSIS — R278 Other lack of coordination: Secondary | ICD-10-CM | POA: Diagnosis not present

## 2023-02-05 DIAGNOSIS — Z9181 History of falling: Secondary | ICD-10-CM | POA: Diagnosis not present

## 2023-02-05 DIAGNOSIS — M6281 Muscle weakness (generalized): Secondary | ICD-10-CM | POA: Diagnosis not present

## 2023-02-05 DIAGNOSIS — R2681 Unsteadiness on feet: Secondary | ICD-10-CM | POA: Diagnosis not present

## 2023-02-05 DIAGNOSIS — M3500 Sicca syndrome, unspecified: Secondary | ICD-10-CM | POA: Diagnosis not present

## 2023-02-07 DIAGNOSIS — M6281 Muscle weakness (generalized): Secondary | ICD-10-CM | POA: Diagnosis not present

## 2023-02-07 DIAGNOSIS — R41841 Cognitive communication deficit: Secondary | ICD-10-CM | POA: Diagnosis not present

## 2023-02-07 DIAGNOSIS — Z9181 History of falling: Secondary | ICD-10-CM | POA: Diagnosis not present

## 2023-02-07 DIAGNOSIS — M3500 Sicca syndrome, unspecified: Secondary | ICD-10-CM | POA: Diagnosis not present

## 2023-02-07 DIAGNOSIS — R278 Other lack of coordination: Secondary | ICD-10-CM | POA: Diagnosis not present

## 2023-02-07 DIAGNOSIS — R2681 Unsteadiness on feet: Secondary | ICD-10-CM | POA: Diagnosis not present

## 2023-02-10 DIAGNOSIS — G309 Alzheimer's disease, unspecified: Secondary | ICD-10-CM | POA: Diagnosis not present

## 2023-02-10 DIAGNOSIS — R278 Other lack of coordination: Secondary | ICD-10-CM | POA: Diagnosis not present

## 2023-02-10 DIAGNOSIS — E039 Hypothyroidism, unspecified: Secondary | ICD-10-CM | POA: Diagnosis not present

## 2023-02-10 DIAGNOSIS — L931 Subacute cutaneous lupus erythematosus: Secondary | ICD-10-CM | POA: Diagnosis not present

## 2023-02-10 DIAGNOSIS — R41841 Cognitive communication deficit: Secondary | ICD-10-CM | POA: Diagnosis not present

## 2023-02-10 DIAGNOSIS — E782 Mixed hyperlipidemia: Secondary | ICD-10-CM | POA: Diagnosis not present

## 2023-02-10 DIAGNOSIS — M6281 Muscle weakness (generalized): Secondary | ICD-10-CM | POA: Diagnosis not present

## 2023-02-10 DIAGNOSIS — M3500 Sicca syndrome, unspecified: Secondary | ICD-10-CM | POA: Diagnosis not present

## 2023-02-11 DIAGNOSIS — E782 Mixed hyperlipidemia: Secondary | ICD-10-CM | POA: Diagnosis not present

## 2023-02-11 DIAGNOSIS — R41841 Cognitive communication deficit: Secondary | ICD-10-CM | POA: Diagnosis not present

## 2023-02-11 DIAGNOSIS — M6281 Muscle weakness (generalized): Secondary | ICD-10-CM | POA: Diagnosis not present

## 2023-02-11 DIAGNOSIS — M3500 Sicca syndrome, unspecified: Secondary | ICD-10-CM | POA: Diagnosis not present

## 2023-02-11 DIAGNOSIS — R278 Other lack of coordination: Secondary | ICD-10-CM | POA: Diagnosis not present

## 2023-02-11 DIAGNOSIS — E039 Hypothyroidism, unspecified: Secondary | ICD-10-CM | POA: Diagnosis not present

## 2023-02-12 DIAGNOSIS — E039 Hypothyroidism, unspecified: Secondary | ICD-10-CM | POA: Diagnosis not present

## 2023-02-12 DIAGNOSIS — R278 Other lack of coordination: Secondary | ICD-10-CM | POA: Diagnosis not present

## 2023-02-12 DIAGNOSIS — R41841 Cognitive communication deficit: Secondary | ICD-10-CM | POA: Diagnosis not present

## 2023-02-12 DIAGNOSIS — M6281 Muscle weakness (generalized): Secondary | ICD-10-CM | POA: Diagnosis not present

## 2023-02-12 DIAGNOSIS — M3500 Sicca syndrome, unspecified: Secondary | ICD-10-CM | POA: Diagnosis not present

## 2023-02-12 DIAGNOSIS — E782 Mixed hyperlipidemia: Secondary | ICD-10-CM | POA: Diagnosis not present

## 2023-02-14 DIAGNOSIS — M3500 Sicca syndrome, unspecified: Secondary | ICD-10-CM | POA: Diagnosis not present

## 2023-02-14 DIAGNOSIS — M6281 Muscle weakness (generalized): Secondary | ICD-10-CM | POA: Diagnosis not present

## 2023-02-14 DIAGNOSIS — E782 Mixed hyperlipidemia: Secondary | ICD-10-CM | POA: Diagnosis not present

## 2023-02-14 DIAGNOSIS — E039 Hypothyroidism, unspecified: Secondary | ICD-10-CM | POA: Diagnosis not present

## 2023-02-14 DIAGNOSIS — R41841 Cognitive communication deficit: Secondary | ICD-10-CM | POA: Diagnosis not present

## 2023-02-14 DIAGNOSIS — R278 Other lack of coordination: Secondary | ICD-10-CM | POA: Diagnosis not present

## 2023-02-17 DIAGNOSIS — R278 Other lack of coordination: Secondary | ICD-10-CM | POA: Diagnosis not present

## 2023-02-17 DIAGNOSIS — R41841 Cognitive communication deficit: Secondary | ICD-10-CM | POA: Diagnosis not present

## 2023-02-17 DIAGNOSIS — E782 Mixed hyperlipidemia: Secondary | ICD-10-CM | POA: Diagnosis not present

## 2023-02-17 DIAGNOSIS — M6281 Muscle weakness (generalized): Secondary | ICD-10-CM | POA: Diagnosis not present

## 2023-02-17 DIAGNOSIS — E039 Hypothyroidism, unspecified: Secondary | ICD-10-CM | POA: Diagnosis not present

## 2023-02-17 DIAGNOSIS — M3500 Sicca syndrome, unspecified: Secondary | ICD-10-CM | POA: Diagnosis not present

## 2023-02-18 DIAGNOSIS — M6281 Muscle weakness (generalized): Secondary | ICD-10-CM | POA: Diagnosis not present

## 2023-02-18 DIAGNOSIS — M3500 Sicca syndrome, unspecified: Secondary | ICD-10-CM | POA: Diagnosis not present

## 2023-02-18 DIAGNOSIS — E782 Mixed hyperlipidemia: Secondary | ICD-10-CM | POA: Diagnosis not present

## 2023-02-18 DIAGNOSIS — R41841 Cognitive communication deficit: Secondary | ICD-10-CM | POA: Diagnosis not present

## 2023-02-18 DIAGNOSIS — E039 Hypothyroidism, unspecified: Secondary | ICD-10-CM | POA: Diagnosis not present

## 2023-02-18 DIAGNOSIS — R278 Other lack of coordination: Secondary | ICD-10-CM | POA: Diagnosis not present

## 2023-02-19 DIAGNOSIS — E782 Mixed hyperlipidemia: Secondary | ICD-10-CM | POA: Diagnosis not present

## 2023-02-19 DIAGNOSIS — M3500 Sicca syndrome, unspecified: Secondary | ICD-10-CM | POA: Diagnosis not present

## 2023-02-19 DIAGNOSIS — E039 Hypothyroidism, unspecified: Secondary | ICD-10-CM | POA: Diagnosis not present

## 2023-02-19 DIAGNOSIS — M6281 Muscle weakness (generalized): Secondary | ICD-10-CM | POA: Diagnosis not present

## 2023-02-19 DIAGNOSIS — R41841 Cognitive communication deficit: Secondary | ICD-10-CM | POA: Diagnosis not present

## 2023-02-19 DIAGNOSIS — R278 Other lack of coordination: Secondary | ICD-10-CM | POA: Diagnosis not present

## 2023-02-20 DIAGNOSIS — E039 Hypothyroidism, unspecified: Secondary | ICD-10-CM | POA: Diagnosis not present

## 2023-02-20 DIAGNOSIS — M3500 Sicca syndrome, unspecified: Secondary | ICD-10-CM | POA: Diagnosis not present

## 2023-02-20 DIAGNOSIS — R278 Other lack of coordination: Secondary | ICD-10-CM | POA: Diagnosis not present

## 2023-02-20 DIAGNOSIS — R41841 Cognitive communication deficit: Secondary | ICD-10-CM | POA: Diagnosis not present

## 2023-02-20 DIAGNOSIS — M6281 Muscle weakness (generalized): Secondary | ICD-10-CM | POA: Diagnosis not present

## 2023-02-20 DIAGNOSIS — E782 Mixed hyperlipidemia: Secondary | ICD-10-CM | POA: Diagnosis not present

## 2023-02-25 DIAGNOSIS — R41841 Cognitive communication deficit: Secondary | ICD-10-CM | POA: Diagnosis not present

## 2023-02-25 DIAGNOSIS — M6281 Muscle weakness (generalized): Secondary | ICD-10-CM | POA: Diagnosis not present

## 2023-02-25 DIAGNOSIS — E782 Mixed hyperlipidemia: Secondary | ICD-10-CM | POA: Diagnosis not present

## 2023-02-25 DIAGNOSIS — E039 Hypothyroidism, unspecified: Secondary | ICD-10-CM | POA: Diagnosis not present

## 2023-02-25 DIAGNOSIS — M3500 Sicca syndrome, unspecified: Secondary | ICD-10-CM | POA: Diagnosis not present

## 2023-02-25 DIAGNOSIS — R278 Other lack of coordination: Secondary | ICD-10-CM | POA: Diagnosis not present

## 2023-02-26 DIAGNOSIS — R41841 Cognitive communication deficit: Secondary | ICD-10-CM | POA: Diagnosis not present

## 2023-02-26 DIAGNOSIS — E782 Mixed hyperlipidemia: Secondary | ICD-10-CM | POA: Diagnosis not present

## 2023-02-26 DIAGNOSIS — E039 Hypothyroidism, unspecified: Secondary | ICD-10-CM | POA: Diagnosis not present

## 2023-02-26 DIAGNOSIS — M3500 Sicca syndrome, unspecified: Secondary | ICD-10-CM | POA: Diagnosis not present

## 2023-02-26 DIAGNOSIS — R278 Other lack of coordination: Secondary | ICD-10-CM | POA: Diagnosis not present

## 2023-02-26 DIAGNOSIS — M6281 Muscle weakness (generalized): Secondary | ICD-10-CM | POA: Diagnosis not present

## 2023-02-27 DIAGNOSIS — R278 Other lack of coordination: Secondary | ICD-10-CM | POA: Diagnosis not present

## 2023-02-27 DIAGNOSIS — R41841 Cognitive communication deficit: Secondary | ICD-10-CM | POA: Diagnosis not present

## 2023-02-27 DIAGNOSIS — M6281 Muscle weakness (generalized): Secondary | ICD-10-CM | POA: Diagnosis not present

## 2023-02-27 DIAGNOSIS — E039 Hypothyroidism, unspecified: Secondary | ICD-10-CM | POA: Diagnosis not present

## 2023-02-27 DIAGNOSIS — E782 Mixed hyperlipidemia: Secondary | ICD-10-CM | POA: Diagnosis not present

## 2023-02-27 DIAGNOSIS — M3500 Sicca syndrome, unspecified: Secondary | ICD-10-CM | POA: Diagnosis not present

## 2023-03-03 DIAGNOSIS — R278 Other lack of coordination: Secondary | ICD-10-CM | POA: Diagnosis not present

## 2023-03-03 DIAGNOSIS — R41841 Cognitive communication deficit: Secondary | ICD-10-CM | POA: Diagnosis not present

## 2023-03-03 DIAGNOSIS — E782 Mixed hyperlipidemia: Secondary | ICD-10-CM | POA: Diagnosis not present

## 2023-03-03 DIAGNOSIS — M3500 Sicca syndrome, unspecified: Secondary | ICD-10-CM | POA: Diagnosis not present

## 2023-03-03 DIAGNOSIS — M6281 Muscle weakness (generalized): Secondary | ICD-10-CM | POA: Diagnosis not present

## 2023-03-03 DIAGNOSIS — E039 Hypothyroidism, unspecified: Secondary | ICD-10-CM | POA: Diagnosis not present

## 2023-03-04 DIAGNOSIS — R278 Other lack of coordination: Secondary | ICD-10-CM | POA: Diagnosis not present

## 2023-03-04 DIAGNOSIS — M6281 Muscle weakness (generalized): Secondary | ICD-10-CM | POA: Diagnosis not present

## 2023-03-04 DIAGNOSIS — R41841 Cognitive communication deficit: Secondary | ICD-10-CM | POA: Diagnosis not present

## 2023-03-04 DIAGNOSIS — E039 Hypothyroidism, unspecified: Secondary | ICD-10-CM | POA: Diagnosis not present

## 2023-03-04 DIAGNOSIS — E782 Mixed hyperlipidemia: Secondary | ICD-10-CM | POA: Diagnosis not present

## 2023-03-04 DIAGNOSIS — M3500 Sicca syndrome, unspecified: Secondary | ICD-10-CM | POA: Diagnosis not present

## 2023-03-05 DIAGNOSIS — R278 Other lack of coordination: Secondary | ICD-10-CM | POA: Diagnosis not present

## 2023-03-05 DIAGNOSIS — M6281 Muscle weakness (generalized): Secondary | ICD-10-CM | POA: Diagnosis not present

## 2023-03-05 DIAGNOSIS — E782 Mixed hyperlipidemia: Secondary | ICD-10-CM | POA: Diagnosis not present

## 2023-03-05 DIAGNOSIS — E039 Hypothyroidism, unspecified: Secondary | ICD-10-CM | POA: Diagnosis not present

## 2023-03-05 DIAGNOSIS — M3500 Sicca syndrome, unspecified: Secondary | ICD-10-CM | POA: Diagnosis not present

## 2023-03-05 DIAGNOSIS — R41841 Cognitive communication deficit: Secondary | ICD-10-CM | POA: Diagnosis not present

## 2023-03-07 DIAGNOSIS — M3500 Sicca syndrome, unspecified: Secondary | ICD-10-CM | POA: Diagnosis not present

## 2023-03-07 DIAGNOSIS — R278 Other lack of coordination: Secondary | ICD-10-CM | POA: Diagnosis not present

## 2023-03-07 DIAGNOSIS — E782 Mixed hyperlipidemia: Secondary | ICD-10-CM | POA: Diagnosis not present

## 2023-03-07 DIAGNOSIS — E039 Hypothyroidism, unspecified: Secondary | ICD-10-CM | POA: Diagnosis not present

## 2023-03-07 DIAGNOSIS — R41841 Cognitive communication deficit: Secondary | ICD-10-CM | POA: Diagnosis not present

## 2023-03-07 DIAGNOSIS — M6281 Muscle weakness (generalized): Secondary | ICD-10-CM | POA: Diagnosis not present

## 2023-03-10 DIAGNOSIS — M6281 Muscle weakness (generalized): Secondary | ICD-10-CM | POA: Diagnosis not present

## 2023-03-10 DIAGNOSIS — M3500 Sicca syndrome, unspecified: Secondary | ICD-10-CM | POA: Diagnosis not present

## 2023-03-10 DIAGNOSIS — E782 Mixed hyperlipidemia: Secondary | ICD-10-CM | POA: Diagnosis not present

## 2023-03-10 DIAGNOSIS — G309 Alzheimer's disease, unspecified: Secondary | ICD-10-CM | POA: Diagnosis not present

## 2023-03-10 DIAGNOSIS — R41841 Cognitive communication deficit: Secondary | ICD-10-CM | POA: Diagnosis not present

## 2023-03-10 DIAGNOSIS — L931 Subacute cutaneous lupus erythematosus: Secondary | ICD-10-CM | POA: Diagnosis not present

## 2023-03-10 DIAGNOSIS — R278 Other lack of coordination: Secondary | ICD-10-CM | POA: Diagnosis not present

## 2023-03-10 DIAGNOSIS — E039 Hypothyroidism, unspecified: Secondary | ICD-10-CM | POA: Diagnosis not present

## 2023-03-12 DIAGNOSIS — E782 Mixed hyperlipidemia: Secondary | ICD-10-CM | POA: Diagnosis not present

## 2023-03-12 DIAGNOSIS — M6281 Muscle weakness (generalized): Secondary | ICD-10-CM | POA: Diagnosis not present

## 2023-03-12 DIAGNOSIS — R41841 Cognitive communication deficit: Secondary | ICD-10-CM | POA: Diagnosis not present

## 2023-03-12 DIAGNOSIS — E039 Hypothyroidism, unspecified: Secondary | ICD-10-CM | POA: Diagnosis not present

## 2023-03-12 DIAGNOSIS — M3500 Sicca syndrome, unspecified: Secondary | ICD-10-CM | POA: Diagnosis not present

## 2023-03-12 DIAGNOSIS — R278 Other lack of coordination: Secondary | ICD-10-CM | POA: Diagnosis not present

## 2023-03-14 DIAGNOSIS — M6281 Muscle weakness (generalized): Secondary | ICD-10-CM | POA: Diagnosis not present

## 2023-03-14 DIAGNOSIS — R278 Other lack of coordination: Secondary | ICD-10-CM | POA: Diagnosis not present

## 2023-03-14 DIAGNOSIS — R41841 Cognitive communication deficit: Secondary | ICD-10-CM | POA: Diagnosis not present

## 2023-03-14 DIAGNOSIS — E039 Hypothyroidism, unspecified: Secondary | ICD-10-CM | POA: Diagnosis not present

## 2023-03-14 DIAGNOSIS — M3500 Sicca syndrome, unspecified: Secondary | ICD-10-CM | POA: Diagnosis not present

## 2023-03-14 DIAGNOSIS — E782 Mixed hyperlipidemia: Secondary | ICD-10-CM | POA: Diagnosis not present

## 2023-03-18 DIAGNOSIS — M3500 Sicca syndrome, unspecified: Secondary | ICD-10-CM | POA: Diagnosis not present

## 2023-03-18 DIAGNOSIS — R41841 Cognitive communication deficit: Secondary | ICD-10-CM | POA: Diagnosis not present

## 2023-03-18 DIAGNOSIS — M6281 Muscle weakness (generalized): Secondary | ICD-10-CM | POA: Diagnosis not present

## 2023-03-18 DIAGNOSIS — E039 Hypothyroidism, unspecified: Secondary | ICD-10-CM | POA: Diagnosis not present

## 2023-03-18 DIAGNOSIS — E782 Mixed hyperlipidemia: Secondary | ICD-10-CM | POA: Diagnosis not present

## 2023-03-18 DIAGNOSIS — R278 Other lack of coordination: Secondary | ICD-10-CM | POA: Diagnosis not present

## 2023-03-19 DIAGNOSIS — M6281 Muscle weakness (generalized): Secondary | ICD-10-CM | POA: Diagnosis not present

## 2023-03-19 DIAGNOSIS — R278 Other lack of coordination: Secondary | ICD-10-CM | POA: Diagnosis not present

## 2023-03-19 DIAGNOSIS — M3500 Sicca syndrome, unspecified: Secondary | ICD-10-CM | POA: Diagnosis not present

## 2023-03-19 DIAGNOSIS — R41841 Cognitive communication deficit: Secondary | ICD-10-CM | POA: Diagnosis not present

## 2023-03-19 DIAGNOSIS — E039 Hypothyroidism, unspecified: Secondary | ICD-10-CM | POA: Diagnosis not present

## 2023-03-19 DIAGNOSIS — E782 Mixed hyperlipidemia: Secondary | ICD-10-CM | POA: Diagnosis not present

## 2023-03-21 DIAGNOSIS — E039 Hypothyroidism, unspecified: Secondary | ICD-10-CM | POA: Diagnosis not present

## 2023-03-21 DIAGNOSIS — E782 Mixed hyperlipidemia: Secondary | ICD-10-CM | POA: Diagnosis not present

## 2023-03-21 DIAGNOSIS — M3500 Sicca syndrome, unspecified: Secondary | ICD-10-CM | POA: Diagnosis not present

## 2023-03-21 DIAGNOSIS — R278 Other lack of coordination: Secondary | ICD-10-CM | POA: Diagnosis not present

## 2023-03-21 DIAGNOSIS — M6281 Muscle weakness (generalized): Secondary | ICD-10-CM | POA: Diagnosis not present

## 2023-03-21 DIAGNOSIS — R41841 Cognitive communication deficit: Secondary | ICD-10-CM | POA: Diagnosis not present

## 2023-03-24 DIAGNOSIS — R278 Other lack of coordination: Secondary | ICD-10-CM | POA: Diagnosis not present

## 2023-03-24 DIAGNOSIS — E782 Mixed hyperlipidemia: Secondary | ICD-10-CM | POA: Diagnosis not present

## 2023-03-24 DIAGNOSIS — E039 Hypothyroidism, unspecified: Secondary | ICD-10-CM | POA: Diagnosis not present

## 2023-03-24 DIAGNOSIS — M3500 Sicca syndrome, unspecified: Secondary | ICD-10-CM | POA: Diagnosis not present

## 2023-03-24 DIAGNOSIS — R41841 Cognitive communication deficit: Secondary | ICD-10-CM | POA: Diagnosis not present

## 2023-03-24 DIAGNOSIS — M6281 Muscle weakness (generalized): Secondary | ICD-10-CM | POA: Diagnosis not present

## 2023-03-25 DIAGNOSIS — E782 Mixed hyperlipidemia: Secondary | ICD-10-CM | POA: Diagnosis not present

## 2023-03-25 DIAGNOSIS — R278 Other lack of coordination: Secondary | ICD-10-CM | POA: Diagnosis not present

## 2023-03-25 DIAGNOSIS — M3500 Sicca syndrome, unspecified: Secondary | ICD-10-CM | POA: Diagnosis not present

## 2023-03-25 DIAGNOSIS — R41841 Cognitive communication deficit: Secondary | ICD-10-CM | POA: Diagnosis not present

## 2023-03-25 DIAGNOSIS — M6281 Muscle weakness (generalized): Secondary | ICD-10-CM | POA: Diagnosis not present

## 2023-03-25 DIAGNOSIS — E039 Hypothyroidism, unspecified: Secondary | ICD-10-CM | POA: Diagnosis not present

## 2023-03-26 DIAGNOSIS — M3500 Sicca syndrome, unspecified: Secondary | ICD-10-CM | POA: Diagnosis not present

## 2023-03-26 DIAGNOSIS — R278 Other lack of coordination: Secondary | ICD-10-CM | POA: Diagnosis not present

## 2023-03-26 DIAGNOSIS — M6281 Muscle weakness (generalized): Secondary | ICD-10-CM | POA: Diagnosis not present

## 2023-03-26 DIAGNOSIS — E782 Mixed hyperlipidemia: Secondary | ICD-10-CM | POA: Diagnosis not present

## 2023-03-26 DIAGNOSIS — E039 Hypothyroidism, unspecified: Secondary | ICD-10-CM | POA: Diagnosis not present

## 2023-03-26 DIAGNOSIS — R41841 Cognitive communication deficit: Secondary | ICD-10-CM | POA: Diagnosis not present

## 2023-03-27 DIAGNOSIS — M3500 Sicca syndrome, unspecified: Secondary | ICD-10-CM | POA: Diagnosis not present

## 2023-03-27 DIAGNOSIS — R278 Other lack of coordination: Secondary | ICD-10-CM | POA: Diagnosis not present

## 2023-03-27 DIAGNOSIS — M6281 Muscle weakness (generalized): Secondary | ICD-10-CM | POA: Diagnosis not present

## 2023-03-27 DIAGNOSIS — E039 Hypothyroidism, unspecified: Secondary | ICD-10-CM | POA: Diagnosis not present

## 2023-03-27 DIAGNOSIS — R41841 Cognitive communication deficit: Secondary | ICD-10-CM | POA: Diagnosis not present

## 2023-03-27 DIAGNOSIS — E782 Mixed hyperlipidemia: Secondary | ICD-10-CM | POA: Diagnosis not present

## 2023-03-31 DIAGNOSIS — E782 Mixed hyperlipidemia: Secondary | ICD-10-CM | POA: Diagnosis not present

## 2023-03-31 DIAGNOSIS — E039 Hypothyroidism, unspecified: Secondary | ICD-10-CM | POA: Diagnosis not present

## 2023-03-31 DIAGNOSIS — R278 Other lack of coordination: Secondary | ICD-10-CM | POA: Diagnosis not present

## 2023-03-31 DIAGNOSIS — M3500 Sicca syndrome, unspecified: Secondary | ICD-10-CM | POA: Diagnosis not present

## 2023-03-31 DIAGNOSIS — M6281 Muscle weakness (generalized): Secondary | ICD-10-CM | POA: Diagnosis not present

## 2023-03-31 DIAGNOSIS — R41841 Cognitive communication deficit: Secondary | ICD-10-CM | POA: Diagnosis not present

## 2023-04-01 DIAGNOSIS — R41841 Cognitive communication deficit: Secondary | ICD-10-CM | POA: Diagnosis not present

## 2023-04-01 DIAGNOSIS — E039 Hypothyroidism, unspecified: Secondary | ICD-10-CM | POA: Diagnosis not present

## 2023-04-01 DIAGNOSIS — M3500 Sicca syndrome, unspecified: Secondary | ICD-10-CM | POA: Diagnosis not present

## 2023-04-01 DIAGNOSIS — R278 Other lack of coordination: Secondary | ICD-10-CM | POA: Diagnosis not present

## 2023-04-01 DIAGNOSIS — M6281 Muscle weakness (generalized): Secondary | ICD-10-CM | POA: Diagnosis not present

## 2023-04-01 DIAGNOSIS — E782 Mixed hyperlipidemia: Secondary | ICD-10-CM | POA: Diagnosis not present

## 2023-04-02 DIAGNOSIS — E782 Mixed hyperlipidemia: Secondary | ICD-10-CM | POA: Diagnosis not present

## 2023-04-02 DIAGNOSIS — E039 Hypothyroidism, unspecified: Secondary | ICD-10-CM | POA: Diagnosis not present

## 2023-04-02 DIAGNOSIS — M6281 Muscle weakness (generalized): Secondary | ICD-10-CM | POA: Diagnosis not present

## 2023-04-02 DIAGNOSIS — M3500 Sicca syndrome, unspecified: Secondary | ICD-10-CM | POA: Diagnosis not present

## 2023-04-02 DIAGNOSIS — R41841 Cognitive communication deficit: Secondary | ICD-10-CM | POA: Diagnosis not present

## 2023-04-02 DIAGNOSIS — R278 Other lack of coordination: Secondary | ICD-10-CM | POA: Diagnosis not present

## 2023-04-03 DIAGNOSIS — M3500 Sicca syndrome, unspecified: Secondary | ICD-10-CM | POA: Diagnosis not present

## 2023-04-03 DIAGNOSIS — E782 Mixed hyperlipidemia: Secondary | ICD-10-CM | POA: Diagnosis not present

## 2023-04-03 DIAGNOSIS — R41841 Cognitive communication deficit: Secondary | ICD-10-CM | POA: Diagnosis not present

## 2023-04-03 DIAGNOSIS — E039 Hypothyroidism, unspecified: Secondary | ICD-10-CM | POA: Diagnosis not present

## 2023-04-03 DIAGNOSIS — R278 Other lack of coordination: Secondary | ICD-10-CM | POA: Diagnosis not present

## 2023-04-03 DIAGNOSIS — M6281 Muscle weakness (generalized): Secondary | ICD-10-CM | POA: Diagnosis not present

## 2023-04-04 DIAGNOSIS — E782 Mixed hyperlipidemia: Secondary | ICD-10-CM | POA: Diagnosis not present

## 2023-04-04 DIAGNOSIS — R278 Other lack of coordination: Secondary | ICD-10-CM | POA: Diagnosis not present

## 2023-04-04 DIAGNOSIS — M6281 Muscle weakness (generalized): Secondary | ICD-10-CM | POA: Diagnosis not present

## 2023-04-04 DIAGNOSIS — R41841 Cognitive communication deficit: Secondary | ICD-10-CM | POA: Diagnosis not present

## 2023-04-04 DIAGNOSIS — E039 Hypothyroidism, unspecified: Secondary | ICD-10-CM | POA: Diagnosis not present

## 2023-04-04 DIAGNOSIS — M3500 Sicca syndrome, unspecified: Secondary | ICD-10-CM | POA: Diagnosis not present

## 2023-04-09 DIAGNOSIS — R41841 Cognitive communication deficit: Secondary | ICD-10-CM | POA: Diagnosis not present

## 2023-04-09 DIAGNOSIS — L931 Subacute cutaneous lupus erythematosus: Secondary | ICD-10-CM | POA: Diagnosis not present

## 2023-04-09 DIAGNOSIS — E039 Hypothyroidism, unspecified: Secondary | ICD-10-CM | POA: Diagnosis not present

## 2023-04-09 DIAGNOSIS — G309 Alzheimer's disease, unspecified: Secondary | ICD-10-CM | POA: Diagnosis not present

## 2023-04-09 DIAGNOSIS — E782 Mixed hyperlipidemia: Secondary | ICD-10-CM | POA: Diagnosis not present

## 2023-04-09 DIAGNOSIS — M3500 Sicca syndrome, unspecified: Secondary | ICD-10-CM | POA: Diagnosis not present

## 2023-04-14 DIAGNOSIS — R41841 Cognitive communication deficit: Secondary | ICD-10-CM | POA: Diagnosis not present

## 2023-04-14 DIAGNOSIS — E039 Hypothyroidism, unspecified: Secondary | ICD-10-CM | POA: Diagnosis not present

## 2023-04-14 DIAGNOSIS — E782 Mixed hyperlipidemia: Secondary | ICD-10-CM | POA: Diagnosis not present

## 2023-04-14 DIAGNOSIS — M3500 Sicca syndrome, unspecified: Secondary | ICD-10-CM | POA: Diagnosis not present

## 2023-04-14 DIAGNOSIS — L931 Subacute cutaneous lupus erythematosus: Secondary | ICD-10-CM | POA: Diagnosis not present

## 2023-04-14 DIAGNOSIS — G309 Alzheimer's disease, unspecified: Secondary | ICD-10-CM | POA: Diagnosis not present

## 2023-04-15 DIAGNOSIS — E039 Hypothyroidism, unspecified: Secondary | ICD-10-CM | POA: Diagnosis not present

## 2023-04-15 DIAGNOSIS — M3500 Sicca syndrome, unspecified: Secondary | ICD-10-CM | POA: Diagnosis not present

## 2023-04-15 DIAGNOSIS — R41841 Cognitive communication deficit: Secondary | ICD-10-CM | POA: Diagnosis not present

## 2023-04-15 DIAGNOSIS — E782 Mixed hyperlipidemia: Secondary | ICD-10-CM | POA: Diagnosis not present

## 2023-04-15 DIAGNOSIS — L931 Subacute cutaneous lupus erythematosus: Secondary | ICD-10-CM | POA: Diagnosis not present

## 2023-04-15 DIAGNOSIS — G309 Alzheimer's disease, unspecified: Secondary | ICD-10-CM | POA: Diagnosis not present

## 2023-04-17 DIAGNOSIS — E039 Hypothyroidism, unspecified: Secondary | ICD-10-CM | POA: Diagnosis not present

## 2023-04-17 DIAGNOSIS — L931 Subacute cutaneous lupus erythematosus: Secondary | ICD-10-CM | POA: Diagnosis not present

## 2023-04-17 DIAGNOSIS — E782 Mixed hyperlipidemia: Secondary | ICD-10-CM | POA: Diagnosis not present

## 2023-04-17 DIAGNOSIS — G309 Alzheimer's disease, unspecified: Secondary | ICD-10-CM | POA: Diagnosis not present

## 2023-04-17 DIAGNOSIS — M3500 Sicca syndrome, unspecified: Secondary | ICD-10-CM | POA: Diagnosis not present

## 2023-04-17 DIAGNOSIS — R41841 Cognitive communication deficit: Secondary | ICD-10-CM | POA: Diagnosis not present

## 2023-04-21 DIAGNOSIS — G309 Alzheimer's disease, unspecified: Secondary | ICD-10-CM | POA: Diagnosis not present

## 2023-04-21 DIAGNOSIS — R41841 Cognitive communication deficit: Secondary | ICD-10-CM | POA: Diagnosis not present

## 2023-04-21 DIAGNOSIS — E782 Mixed hyperlipidemia: Secondary | ICD-10-CM | POA: Diagnosis not present

## 2023-04-21 DIAGNOSIS — L931 Subacute cutaneous lupus erythematosus: Secondary | ICD-10-CM | POA: Diagnosis not present

## 2023-04-21 DIAGNOSIS — E039 Hypothyroidism, unspecified: Secondary | ICD-10-CM | POA: Diagnosis not present

## 2023-04-21 DIAGNOSIS — M3500 Sicca syndrome, unspecified: Secondary | ICD-10-CM | POA: Diagnosis not present

## 2023-04-23 DIAGNOSIS — G309 Alzheimer's disease, unspecified: Secondary | ICD-10-CM | POA: Diagnosis not present

## 2023-04-23 DIAGNOSIS — L931 Subacute cutaneous lupus erythematosus: Secondary | ICD-10-CM | POA: Diagnosis not present

## 2023-04-23 DIAGNOSIS — E039 Hypothyroidism, unspecified: Secondary | ICD-10-CM | POA: Diagnosis not present

## 2023-04-23 DIAGNOSIS — M3500 Sicca syndrome, unspecified: Secondary | ICD-10-CM | POA: Diagnosis not present

## 2023-04-23 DIAGNOSIS — E782 Mixed hyperlipidemia: Secondary | ICD-10-CM | POA: Diagnosis not present

## 2023-04-23 DIAGNOSIS — R41841 Cognitive communication deficit: Secondary | ICD-10-CM | POA: Diagnosis not present

## 2023-04-28 DIAGNOSIS — L931 Subacute cutaneous lupus erythematosus: Secondary | ICD-10-CM | POA: Diagnosis not present

## 2023-04-28 DIAGNOSIS — E782 Mixed hyperlipidemia: Secondary | ICD-10-CM | POA: Diagnosis not present

## 2023-04-28 DIAGNOSIS — M3500 Sicca syndrome, unspecified: Secondary | ICD-10-CM | POA: Diagnosis not present

## 2023-04-28 DIAGNOSIS — R41841 Cognitive communication deficit: Secondary | ICD-10-CM | POA: Diagnosis not present

## 2023-04-28 DIAGNOSIS — E039 Hypothyroidism, unspecified: Secondary | ICD-10-CM | POA: Diagnosis not present

## 2023-04-28 DIAGNOSIS — G309 Alzheimer's disease, unspecified: Secondary | ICD-10-CM | POA: Diagnosis not present

## 2023-05-05 DIAGNOSIS — M3500 Sicca syndrome, unspecified: Secondary | ICD-10-CM | POA: Diagnosis not present

## 2023-05-05 DIAGNOSIS — E782 Mixed hyperlipidemia: Secondary | ICD-10-CM | POA: Diagnosis not present

## 2023-05-05 DIAGNOSIS — G309 Alzheimer's disease, unspecified: Secondary | ICD-10-CM | POA: Diagnosis not present

## 2023-05-05 DIAGNOSIS — R41841 Cognitive communication deficit: Secondary | ICD-10-CM | POA: Diagnosis not present

## 2023-05-05 DIAGNOSIS — E039 Hypothyroidism, unspecified: Secondary | ICD-10-CM | POA: Diagnosis not present

## 2023-05-05 DIAGNOSIS — L931 Subacute cutaneous lupus erythematosus: Secondary | ICD-10-CM | POA: Diagnosis not present

## 2023-05-07 DIAGNOSIS — R41841 Cognitive communication deficit: Secondary | ICD-10-CM | POA: Diagnosis not present

## 2023-05-07 DIAGNOSIS — E782 Mixed hyperlipidemia: Secondary | ICD-10-CM | POA: Diagnosis not present

## 2023-05-07 DIAGNOSIS — M3500 Sicca syndrome, unspecified: Secondary | ICD-10-CM | POA: Diagnosis not present

## 2023-05-07 DIAGNOSIS — L931 Subacute cutaneous lupus erythematosus: Secondary | ICD-10-CM | POA: Diagnosis not present

## 2023-05-07 DIAGNOSIS — G309 Alzheimer's disease, unspecified: Secondary | ICD-10-CM | POA: Diagnosis not present

## 2023-05-07 DIAGNOSIS — E039 Hypothyroidism, unspecified: Secondary | ICD-10-CM | POA: Diagnosis not present

## 2023-05-13 DIAGNOSIS — E039 Hypothyroidism, unspecified: Secondary | ICD-10-CM | POA: Diagnosis not present

## 2023-05-13 DIAGNOSIS — R41841 Cognitive communication deficit: Secondary | ICD-10-CM | POA: Diagnosis not present

## 2023-05-13 DIAGNOSIS — E782 Mixed hyperlipidemia: Secondary | ICD-10-CM | POA: Diagnosis not present

## 2023-05-13 DIAGNOSIS — M3500 Sicca syndrome, unspecified: Secondary | ICD-10-CM | POA: Diagnosis not present

## 2023-05-13 DIAGNOSIS — G309 Alzheimer's disease, unspecified: Secondary | ICD-10-CM | POA: Diagnosis not present

## 2023-05-13 DIAGNOSIS — L931 Subacute cutaneous lupus erythematosus: Secondary | ICD-10-CM | POA: Diagnosis not present

## 2023-05-14 ENCOUNTER — Other Ambulatory Visit: Payer: Self-pay | Admitting: Family Medicine

## 2023-05-14 DIAGNOSIS — M3500 Sicca syndrome, unspecified: Secondary | ICD-10-CM | POA: Diagnosis not present

## 2023-05-14 DIAGNOSIS — H04129 Dry eye syndrome of unspecified lacrimal gland: Secondary | ICD-10-CM | POA: Diagnosis not present

## 2023-05-14 DIAGNOSIS — Z1331 Encounter for screening for depression: Secondary | ICD-10-CM | POA: Diagnosis not present

## 2023-05-14 DIAGNOSIS — M8580 Other specified disorders of bone density and structure, unspecified site: Secondary | ICD-10-CM

## 2023-05-14 DIAGNOSIS — G309 Alzheimer's disease, unspecified: Secondary | ICD-10-CM | POA: Diagnosis not present

## 2023-05-14 DIAGNOSIS — E782 Mixed hyperlipidemia: Secondary | ICD-10-CM | POA: Diagnosis not present

## 2023-05-14 DIAGNOSIS — E039 Hypothyroidism, unspecified: Secondary | ICD-10-CM | POA: Diagnosis not present

## 2023-05-14 DIAGNOSIS — L931 Subacute cutaneous lupus erythematosus: Secondary | ICD-10-CM | POA: Diagnosis not present

## 2023-05-14 DIAGNOSIS — R41841 Cognitive communication deficit: Secondary | ICD-10-CM | POA: Diagnosis not present

## 2023-05-14 DIAGNOSIS — Z0000 Encounter for general adult medical examination without abnormal findings: Secondary | ICD-10-CM | POA: Diagnosis not present

## 2023-05-21 DIAGNOSIS — E039 Hypothyroidism, unspecified: Secondary | ICD-10-CM | POA: Diagnosis not present

## 2023-05-21 DIAGNOSIS — R41841 Cognitive communication deficit: Secondary | ICD-10-CM | POA: Diagnosis not present

## 2023-05-21 DIAGNOSIS — E782 Mixed hyperlipidemia: Secondary | ICD-10-CM | POA: Diagnosis not present

## 2023-05-21 DIAGNOSIS — G309 Alzheimer's disease, unspecified: Secondary | ICD-10-CM | POA: Diagnosis not present

## 2023-05-21 DIAGNOSIS — M3500 Sicca syndrome, unspecified: Secondary | ICD-10-CM | POA: Diagnosis not present

## 2023-05-21 DIAGNOSIS — L931 Subacute cutaneous lupus erythematosus: Secondary | ICD-10-CM | POA: Diagnosis not present

## 2023-05-23 DIAGNOSIS — E782 Mixed hyperlipidemia: Secondary | ICD-10-CM | POA: Diagnosis not present

## 2023-05-23 DIAGNOSIS — R41841 Cognitive communication deficit: Secondary | ICD-10-CM | POA: Diagnosis not present

## 2023-05-23 DIAGNOSIS — G309 Alzheimer's disease, unspecified: Secondary | ICD-10-CM | POA: Diagnosis not present

## 2023-05-23 DIAGNOSIS — L931 Subacute cutaneous lupus erythematosus: Secondary | ICD-10-CM | POA: Diagnosis not present

## 2023-05-23 DIAGNOSIS — M3500 Sicca syndrome, unspecified: Secondary | ICD-10-CM | POA: Diagnosis not present

## 2023-05-23 DIAGNOSIS — E039 Hypothyroidism, unspecified: Secondary | ICD-10-CM | POA: Diagnosis not present

## 2023-05-26 DIAGNOSIS — R41841 Cognitive communication deficit: Secondary | ICD-10-CM | POA: Diagnosis not present

## 2023-05-26 DIAGNOSIS — E782 Mixed hyperlipidemia: Secondary | ICD-10-CM | POA: Diagnosis not present

## 2023-05-26 DIAGNOSIS — G309 Alzheimer's disease, unspecified: Secondary | ICD-10-CM | POA: Diagnosis not present

## 2023-05-26 DIAGNOSIS — M3500 Sicca syndrome, unspecified: Secondary | ICD-10-CM | POA: Diagnosis not present

## 2023-05-26 DIAGNOSIS — E039 Hypothyroidism, unspecified: Secondary | ICD-10-CM | POA: Diagnosis not present

## 2023-05-26 DIAGNOSIS — L931 Subacute cutaneous lupus erythematosus: Secondary | ICD-10-CM | POA: Diagnosis not present

## 2023-06-03 DIAGNOSIS — L931 Subacute cutaneous lupus erythematosus: Secondary | ICD-10-CM | POA: Diagnosis not present

## 2023-06-03 DIAGNOSIS — E039 Hypothyroidism, unspecified: Secondary | ICD-10-CM | POA: Diagnosis not present

## 2023-06-03 DIAGNOSIS — R41841 Cognitive communication deficit: Secondary | ICD-10-CM | POA: Diagnosis not present

## 2023-06-03 DIAGNOSIS — E782 Mixed hyperlipidemia: Secondary | ICD-10-CM | POA: Diagnosis not present

## 2023-06-03 DIAGNOSIS — M3500 Sicca syndrome, unspecified: Secondary | ICD-10-CM | POA: Diagnosis not present

## 2023-06-03 DIAGNOSIS — G309 Alzheimer's disease, unspecified: Secondary | ICD-10-CM | POA: Diagnosis not present

## 2023-11-04 DIAGNOSIS — Z23 Encounter for immunization: Secondary | ICD-10-CM | POA: Diagnosis not present
